# Patient Record
Sex: Female | Born: 1960 | Race: White | Hispanic: No | Marital: Married | Smoking: Never smoker
Health system: Southern US, Community
[De-identification: ages and names within clinical notes are randomized; demographics above are authoritative.]

---

## 1988-03-27 HISTORY — PX: CHOLECYSTECTOMY: SHX55

## 2006-03-27 HISTORY — PX: GASTRIC BYPASS: SHX52

## 2016-08-31 LAB — HM MAMMOGRAPHY

## 2019-04-28 ENCOUNTER — Other Ambulatory Visit: Payer: Self-pay

## 2019-04-28 DIAGNOSIS — N3281 Overactive bladder: Secondary | ICD-10-CM | POA: Insufficient documentation

## 2019-04-28 DIAGNOSIS — F900 Attention-deficit hyperactivity disorder, predominantly inattentive type: Secondary | ICD-10-CM

## 2019-04-28 DIAGNOSIS — D508 Other iron deficiency anemias: Secondary | ICD-10-CM | POA: Insufficient documentation

## 2019-04-28 DIAGNOSIS — E034 Atrophy of thyroid (acquired): Secondary | ICD-10-CM

## 2019-04-28 HISTORY — DX: Atrophy of thyroid (acquired): E03.4

## 2019-04-28 HISTORY — DX: Other iron deficiency anemias: D50.8

## 2019-04-28 HISTORY — DX: Attention-deficit hyperactivity disorder, predominantly inattentive type: F90.0

## 2019-04-28 HISTORY — DX: Overactive bladder: N32.81

## 2019-04-30 ENCOUNTER — Other Ambulatory Visit: Payer: Self-pay

## 2019-04-30 MED ORDER — MELOXICAM 7.5 MG PO TABS: 7.5000 mg | ORAL_TABLET | Freq: Two times a day (BID) | ORAL | 2 refills | Status: DC

## 2019-04-30 MED ORDER — OXYBUTYNIN CHLORIDE ER 15 MG PO TB24: 15.0000 mg | ORAL_TABLET | Freq: Every day | ORAL | 1 refills | Status: DC

## 2019-05-05 ENCOUNTER — Other Ambulatory Visit: Payer: Self-pay

## 2019-05-05 MED ORDER — OXYBUTYNIN CHLORIDE ER 15 MG PO TB24: 15.0000 mg | ORAL_TABLET | Freq: Every day | ORAL | 1 refills | Status: DC

## 2019-05-05 MED ORDER — MELOXICAM 7.5 MG PO TABS: 7.5000 mg | ORAL_TABLET | Freq: Two times a day (BID) | ORAL | 0 refills | Status: DC

## 2019-05-06 ENCOUNTER — Ambulatory Visit: Payer: BC Managed Care – PPO | Admitting: Family Medicine

## 2019-05-06 ENCOUNTER — Encounter: Payer: Self-pay | Admitting: Family Medicine

## 2019-05-06 ENCOUNTER — Other Ambulatory Visit: Payer: Self-pay

## 2019-05-06 VITALS — BP 128/84 | HR 78 | Temp 98.2°F | Resp 16 | Ht 62.0 in | Wt 200.2 lb

## 2019-05-06 DIAGNOSIS — F900 Attention-deficit hyperactivity disorder, predominantly inattentive type: Secondary | ICD-10-CM

## 2019-05-06 DIAGNOSIS — Z23 Encounter for immunization: Secondary | ICD-10-CM | POA: Insufficient documentation

## 2019-05-06 DIAGNOSIS — E28319 Asymptomatic premature menopause: Secondary | ICD-10-CM

## 2019-05-06 DIAGNOSIS — M791 Myalgia, unspecified site: Secondary | ICD-10-CM

## 2019-05-06 DIAGNOSIS — Z6836 Body mass index (BMI) 36.0-36.9, adult: Secondary | ICD-10-CM | POA: Insufficient documentation

## 2019-05-06 DIAGNOSIS — D649 Anemia, unspecified: Secondary | ICD-10-CM | POA: Insufficient documentation

## 2019-05-06 DIAGNOSIS — K909 Intestinal malabsorption, unspecified: Secondary | ICD-10-CM | POA: Insufficient documentation

## 2019-05-06 DIAGNOSIS — Z6837 Body mass index (BMI) 37.0-37.9, adult: Secondary | ICD-10-CM | POA: Insufficient documentation

## 2019-05-06 DIAGNOSIS — Z1211 Encounter for screening for malignant neoplasm of colon: Secondary | ICD-10-CM

## 2019-05-06 DIAGNOSIS — E034 Atrophy of thyroid (acquired): Secondary | ICD-10-CM

## 2019-05-06 DIAGNOSIS — N3941 Urge incontinence: Secondary | ICD-10-CM

## 2019-05-06 DIAGNOSIS — D508 Other iron deficiency anemias: Secondary | ICD-10-CM | POA: Diagnosis not present

## 2019-05-06 DIAGNOSIS — K9089 Other intestinal malabsorption: Secondary | ICD-10-CM

## 2019-05-06 DIAGNOSIS — Z1231 Encounter for screening mammogram for malignant neoplasm of breast: Secondary | ICD-10-CM

## 2019-05-06 MED ORDER — EUTHYROX 137 MCG PO TABS: 137.0000 ug | ORAL_TABLET | Freq: Every day | ORAL | 1 refills | Status: DC

## 2019-05-06 MED ORDER — EZ FLU SHOT-FLUCELVAX QUAD 0.5 ML IM PSKT: 0.5000 mL | PREFILLED_SYRINGE | Freq: Once | INTRAMUSCULAR | 0 refills | Status: AC

## 2019-05-06 MED ORDER — MELOXICAM 7.5 MG PO TABS: 7.5000 mg | ORAL_TABLET | Freq: Two times a day (BID) | ORAL | 0 refills | Status: DC

## 2019-05-06 MED ORDER — OXYBUTYNIN CHLORIDE ER 15 MG PO TB24: 15.0000 mg | ORAL_TABLET | Freq: Every day | ORAL | 1 refills | Status: DC

## 2019-05-06 MED ORDER — AMPHETAMINE-DEXTROAMPHETAMINE 15 MG PO TABS: 1.0000 | ORAL_TABLET | Freq: Two times a day (BID) | ORAL | 0 refills | Status: DC

## 2019-05-06 NOTE — Patient Instructions (Signed)
Labs ordered.  Mammogram and Cologuard ordered.  Return for physical.  Recommend healthy diet and exercise.  All medications refilled.    Mammogram A mammogram is an X-ray of the breasts that is done to check for changes that are not normal. This test can screen for and find any changes that may suggest breast cancer. Mammograms are regularly done on women. A man may have a mammogram if he has a lump or swelling in his breast. This test can also help to find other changes and variations in the breast. Tell a doctor:  About any allergies you have.  If you have breast implants.  If you have had previous breast disease, biopsy, or surgery.  If you are breastfeeding.  If you are younger than age 46.  If you have a family history of breast cancer.  Whether you are pregnant or may be pregnant. What are the risks? Generally, this is a safe procedure. However, problems may occur, including:  Exposure to radiation. Radiation levels are very low with this test.  The results being misinterpreted.  The need for further tests.  The inability of the mammogram to detect certain cancers. What happens before the procedure?  Have this test done about 1-2 weeks after your period. This is usually when your breasts are the least tender.  If you are visiting a new doctor or clinic, send any past mammogram images to your new doctor's office.  Wash your breasts and under your arms the day of the test.  Do not use deodorants, perfumes, lotions, or powders on the day of the test.  Take off any jewelry from your neck.  Wear clothes that you can change into and out of easily. What happens during the procedure?   You will undress from the waist up. You will put on a gown.  You will stand in front of the X-ray machine.  Each breast will be placed between two plastic or glass plates. The plates will press down on your breast for a few seconds. Try to stay as relaxed as possible. This does not  cause any harm to your breasts. Any discomfort you feel will be very brief.  X-rays will be taken from different angles of each breast. The procedure may vary among doctors and hospitals. What happens after the procedure?  The mammogram will be read by a specialist (radiologist).  You may need to do certain parts of the test again. This depends on the quality of the images.  Ask when your test results will be ready. Make sure you get your test results.  You may go back to your normal activities. Summary  A mammogram is a low energy X-ray of the breasts that is done to check for abnormal changes. A man may have this test if he has a lump or swelling in his breast.  Before the procedure, tell your doctor about any breast problems that you have had in the past.  Have this test done about 1-2 weeks after your period.  For the test, each breast will be placed between two plastic or glass plates. The plates will press down on your breast for a few seconds.  The mammogram will be read by a specialist (radiologist). Ask when your test results will be ready. Make sure you get your test results. This information is not intended to replace advice given to you by your health care provider. Make sure you discuss any questions you have with your health care provider. Document Revised:  11/01/2017 Document Reviewed: 11/01/2017 Elsevier Patient Education  2020 [employer].

## 2019-05-06 NOTE — Progress Notes (Signed)
Established Patient Office Visit  Subjective:  Patient ID: Ashley Colon, female    DOB: 23-Sep-1960  Age: 59 y.o. MRN: [id number]  CC:  Chief Complaint  Patient presents with  . Hypothyroidism  . Anemia  . ADHD    HPI Ashley Colon presents for Anemia Presents for follow-up visit. Symptoms include malaise/fatigue. There has been no abdominal pain or fever. Signs of blood loss that are not present include melena and menorrhagia. There are no compliance problems.   Hypothyroidism: Patient presents for evaluation of thyroid function. Symptoms consist of fatigue and muscle pain. Symptoms have present for 2 months. The symptoms are mild.  The problem has been gradually worsening.  Previous thyroid studies include TSH. TSH was normal in 01/2019. The hypothyroidism is due to hypothyroidism unknown etiology.  ADHD - Patient does well on adderal. No issues with attention.  Past Medical History:  Diagnosis Date  . Atrophy of thyroid 04/28/2019  . Attention deficit hyperactivity disorder (ADHD), predominantly inattentive type 04/28/2019  . Other iron deficiency anemias 04/28/2019  . Overactive bladder 04/28/2019    Past Surgical History:  Procedure Laterality Date  . CESAREAN SECTION  1999  . CHOLECYSTECTOMY  1990  . GASTRIC BYPASS  2008    Family History  Problem Relation Age of Onset  . Pulmonary embolism Mother   . Arthritis/Rheumatoid Mother   . Coronary artery disease Father   . Diverticulitis Father   . Arthritis Father   . Lung cancer Maternal Grandfather   . Breast cancer Maternal Aunt     Social History   Socioeconomic History  . Marital status: Married    Spouse name: Not on file  . Number of children: 1  . Years of education: Not on file  . Highest education level: Not on file  Occupational History  . Occupation: Engineer, water  Tobacco Use  . Smoking status: Never Smoker  . Smokeless tobacco: Never Used  Substance and Sexual Activity  . Alcohol use: Yes     Comment: rare  . Drug use: Never  . Sexual activity: Not on file  Other Topics Concern  . Not on file  Social History Narrative  . Not on file   Social Determinants of Health   Financial Resource Strain:   . Difficulty of Paying Living Expenses: Not on file  Food Insecurity:   . Worried About Charity fundraiser in the Last Year: Not on file  . Ran Out of Food in the Last Year: Not on file  Transportation Needs:   . Lack of Transportation (Medical): Not on file  . Lack of Transportation (Non-Medical): Not on file  Physical Activity:   . Days of Exercise per Week: Not on file  . Minutes of Exercise per Session: Not on file  Stress:   . Feeling of Stress : Not on file  Social Connections:   . Frequency of Communication with Friends and Family: Not on file  . Frequency of Social Gatherings with Friends and Family: Not on file  . Attends Religious Services: Not on file  . Active Member of Clubs or Organizations: Not on file  . Attends Archivist Meetings: Not on file  . Marital Status: Not on file  Intimate Partner Violence:   . Fear of Current or Ex-Partner: Not on file  . Emotionally Abused: Not on file  . Physically Abused: Not on file  . Sexually Abused: Not on file    Outpatient Medications Prior to Visit  Medication  Sig Dispense Refill  . Multiple Vitamin (MULTIVITAMIN) tablet Take 1 tablet by mouth 2 (two) times daily.    Marland Kitchen amphetamine-dextroamphetamine (ADDERALL) 15 MG tablet Take 1 tablet by mouth 2 (two) times daily.    Arna Medici 137 MCG tablet Take 137 mcg by mouth daily.    . meloxicam (MOBIC) 7.5 MG tablet Take 1 tablet (7.5 mg total) by mouth 2 (two) times daily. 180 tablet 0  . oxybutynin (DITROPAN XL) 15 MG 24 hr tablet Take 1 tablet (15 mg total) by mouth daily. 90 tablet 1   No facility-administered medications prior to visit.    Allergies  Allergen Reactions  . Oxycodone-Acetaminophen Rash and Itching    ROS Review of Systems   Constitutional: Positive for chills, fatigue and malaise/fatigue. Negative for fever.  HENT: Negative for congestion, ear pain and sore throat.   Respiratory: Negative for cough and shortness of breath.   Cardiovascular: Negative for chest pain.  Gastrointestinal: Negative for abdominal pain, blood in stool, constipation, diarrhea, melena, nausea and vomiting.  Genitourinary: Negative for dysuria, menorrhagia and urgency.       Urge incontinence resolved with oxybutynin.  Musculoskeletal: Positive for myalgias. Negative for arthralgias.  Neurological: Negative for dizziness and headaches.  Psychiatric/Behavioral: Negative for dysphoric mood. The patient is not nervous/anxious.       Objective:     BP 128/84 (BP Location: Left Arm, Patient Position: Sitting, Cuff Size: Normal)   Pulse 78   Temp 98.2 F (36.8 C)   Resp 16   Ht 5' 2"  (1.575 m)   Wt 200 lb 3.2 oz (90.8 kg)   BMI 36.62 kg/m  Wt Readings from Last 3 Encounters:  05/06/19 200 lb 3.2 oz (90.8 kg)  02/03/19 193 lb (87.5 kg)  02/03/19 193 lb (87.5 kg)   Physical Exam  Constitutional: She is oriented to person, place, and time. She appears well-developed and well-nourished.  Cardiovascular: Normal rate, regular rhythm, normal heart sounds and normal pulses.  No murmur heard. Pulmonary/Chest: Effort normal and breath sounds normal. No respiratory distress. She has no wheezes. She has no rales.  Abdominal: Soft. Bowel sounds are normal. There is no abdominal tenderness.  Musculoskeletal:        General: No tenderness or edema. Normal range of motion.  Neurological: She is alert and oriented to person, place, and time.  Psychiatric: She has a normal mood and affect.     Health Maintenance Due  Topic Date Due  . Hepatitis C Screening  04-Mar-1961  . HIV Screening  01/18/1978  . TETANUS/TDAP  01/18/1982  . PAP SMEAR-Modifier  01/19/1984  . MAMMOGRAM  01/18/2013  . COLONOSCOPY  01/18/2013  . INFLUENZA VACCINE   10/26/2018    There are no preventive care reminders to display for this patient.  No results found for: TSH No results found for: WBC, HGB, HCT, MCV, PLT No results found for: NA, K, CHLORIDE, CO2, GLUCOSE, BUN, CREATININE, BILITOT, ALKPHOS, AST, ALT, PROT, ALBUMIN, CALCIUM, ANIONGAP, EGFR, GFR No results found for: CHOL No results found for: HDL No results found for: LDLCALC No results found for: TRIG No results found for: CHOLHDL No results found for: HGBA1C    Assessment & Plan:   Problem List Items Addressed This Visit      Digestive   Malabsorption   Relevant Orders   B12 and Folate Panel   VITAMIN D 25 Hydroxy (Vit-D Deficiency, Fractures)   Comp. Metabolic Panel (12)     Endocrine  Hypothyroidism due to acquired atrophy of thyroid   Relevant Medications   EUTHYROX 137 MCG tablet   Other Relevant Orders   TSH     Other   Attention deficit hyperactivity disorder (ADHD), predominantly inattentive type   Relevant Medications   amphetamine-dextroamphetamine (ADDERALL) 15 MG tablet   Absolute anemia - Primary   Relevant Orders   CBC with Differential/Platelet   Iron, TIBC and Ferritin Panel   Myalgia   Relevant Medications   meloxicam (MOBIC) 7.5 MG tablet   Urge incontinence   Relevant Medications   oxybutynin (DITROPAN XL) 15 MG 24 hr tablet   Needs flu shot   Relevant Medications   Influenza Vac Subunit Quad (EZ FLU SHOT-FLUCELVAX QUAD) 0.5 ML PSKT   Premature menopause   Morbid obesity (HCC)   Relevant Medications   amphetamine-dextroamphetamine (ADDERALL) 15 MG tablet   Other Relevant Orders   Lipid panel   BMI 36.0-36.9,adult   Relevant Orders   Lipid panel    Other Visit Diagnoses    Screen for colon cancer       Relevant Orders   Cologuard   Encounter for screening mammogram for breast cancer       Relevant Orders   MM Digital Screening      Meds ordered this encounter  Medications  . Influenza Vac Subunit Quad (EZ FLU SHOT-FLUCELVAX  QUAD) 0.5 ML PSKT    Sig: Inject 0.5 mLs into the muscle once for 1 dose.    Dispense:  1 each    Refill:  0  . amphetamine-dextroamphetamine (ADDERALL) 15 MG tablet    Sig: Take 1 tablet by mouth 2 (two) times daily.    Dispense:  60 tablet    Refill:  0  . EUTHYROX 137 MCG tablet    Sig: Take 1 tablet (137 mcg total) by mouth daily.    Dispense:  90 tablet    Refill:  1  . meloxicam (MOBIC) 7.5 MG tablet    Sig: Take 1 tablet (7.5 mg total) by mouth 2 (two) times daily.    Dispense:  180 tablet    Refill:  0  . oxybutynin (DITROPAN XL) 15 MG 24 hr tablet    Sig: Take 1 tablet (15 mg total) by mouth daily.    Dispense:  90 tablet    Refill:  1    Follow-up: Return in about 4 weeks (around 06/03/2019) for physical.    Rochel Brome, MD

## 2019-05-07 LAB — CBC WITH DIFFERENTIAL/PLATELET
Basophils Absolute: 0.1 10*3/uL (ref 0.0–0.2)
Basos: 1 %
EOS (ABSOLUTE): 0.2 10*3/uL (ref 0.0–0.4)
Eos: 4 %
Hematocrit: 43.1 % (ref 34.0–46.6)
Hemoglobin: 14 g/dL (ref 11.1–15.9)
Immature Grans (Abs): 0 10*3/uL (ref 0.0–0.1)
Immature Granulocytes: 0 %
Lymphocytes Absolute: 2.1 10*3/uL (ref 0.7–3.1)
Lymphs: 34 %
MCH: 28.3 pg (ref 26.6–33.0)
MCHC: 32.5 g/dL (ref 31.5–35.7)
MCV: 87 fL (ref 79–97)
Monocytes Absolute: 0.4 10*3/uL (ref 0.1–0.9)
Monocytes: 7 %
Neutrophils Absolute: 3.4 10*3/uL (ref 1.4–7.0)
Neutrophils: 54 %
Platelets: 303 10*3/uL (ref 150–450)
RBC: 4.95 x10E6/uL (ref 3.77–5.28)
RDW: 13.7 % (ref 11.7–15.4)
WBC: 6.2 10*3/uL (ref 3.4–10.8)

## 2019-05-07 LAB — LIPID PANEL
Chol/HDL Ratio: 4.7 ratio — ABNORMAL HIGH (ref 0.0–4.4)
Cholesterol, Total: 156 mg/dL (ref 100–199)
HDL: 33 mg/dL — ABNORMAL LOW (ref >39)
LDL Chol Calc (NIH): 94 mg/dL (ref 0–99)
Triglycerides: 163 mg/dL — ABNORMAL HIGH (ref 0–149)
VLDL Cholesterol Cal: 29 mg/dL (ref 5–40)

## 2019-05-07 LAB — COMP. METABOLIC PANEL (12)
AST: 17 IU/L (ref 0–40)
Albumin/Globulin Ratio: 1.6 (ref 1.2–2.2)
Albumin: 4.1 g/dL (ref 3.8–4.9)
Alkaline Phosphatase: 87 IU/L (ref 39–117)
BUN/Creatinine Ratio: 12 (ref 9–23)
BUN: 8 mg/dL (ref 6–24)
Bilirubin Total: 0.4 mg/dL (ref 0.0–1.2)
Calcium: 8.9 mg/dL (ref 8.7–10.2)
Chloride: 105 mmol/L (ref 96–106)
Creatinine, Ser: 0.68 mg/dL (ref 0.57–1.00)
GFR calc Af Amer: 113 mL/min/{1.73_m2} (ref >59)
GFR calc non Af Amer: 98 mL/min/{1.73_m2} (ref >59)
Globulin, Total: 2.5 g/dL (ref 1.5–4.5)
Glucose: 98 mg/dL (ref 65–99)
Potassium: 4 mmol/L (ref 3.5–5.2)
Sodium: 143 mmol/L (ref 134–144)
Total Protein: 6.6 g/dL (ref 6.0–8.5)

## 2019-05-07 LAB — CARDIOVASCULAR RISK ASSESSMENT

## 2019-05-07 LAB — B12 AND FOLATE PANEL
Folate: >20 ng/mL (ref >3.0)
Vitamin B-12: 1070 pg/mL (ref 232–1245)

## 2019-05-07 LAB — IRON,TIBC AND FERRITIN PANEL
Ferritin: 21 ng/mL (ref 15–150)
Iron Saturation: 17 % (ref 15–55)
Iron: 57 ug/dL (ref 27–159)
Total Iron Binding Capacity: 335 ug/dL (ref 250–450)
UIBC: 278 ug/dL (ref 131–425)

## 2019-05-07 LAB — TSH: TSH: 1.6 u[IU]/mL (ref 0.450–4.500)

## 2019-05-07 LAB — VITAMIN D 25 HYDROXY (VIT D DEFICIENCY, FRACTURES): Vit D, 25-Hydroxy: 24.5 ng/mL — ABNORMAL LOW (ref 30.0–100.0)

## 2019-05-08 ENCOUNTER — Other Ambulatory Visit: Payer: Self-pay

## 2019-05-08 DIAGNOSIS — R7989 Other specified abnormal findings of blood chemistry: Secondary | ICD-10-CM

## 2019-05-08 MED ORDER — VITAMIN D (ERGOCALCIFEROL) 1.25 MG (50000 UNIT) PO CAPS: 50000.0000 [IU] | ORAL_CAPSULE | ORAL | 2 refills | Status: DC

## 2019-05-08 NOTE — Telephone Encounter (Signed)
Patient was informed. Sent prescription to [facility]

## 2019-06-03 ENCOUNTER — Ambulatory Visit (INDEPENDENT_AMBULATORY_CARE_PROVIDER_SITE_OTHER): Payer: BC Managed Care – PPO | Admitting: Family Medicine

## 2019-06-03 ENCOUNTER — Encounter: Payer: Self-pay | Admitting: Family Medicine

## 2019-06-03 ENCOUNTER — Other Ambulatory Visit: Payer: Self-pay

## 2019-06-03 VITALS — BP 156/90 | HR 84 | Temp 97.9°F | Resp 16 | Ht 62.0 in | Wt 200.8 lb

## 2019-06-03 DIAGNOSIS — Z1211 Encounter for screening for malignant neoplasm of colon: Secondary | ICD-10-CM | POA: Diagnosis not present

## 2019-06-03 DIAGNOSIS — Z1231 Encounter for screening mammogram for malignant neoplasm of breast: Secondary | ICD-10-CM | POA: Diagnosis not present

## 2019-06-03 DIAGNOSIS — Z0000 Encounter for general adult medical examination without abnormal findings: Secondary | ICD-10-CM | POA: Diagnosis not present

## 2019-06-03 DIAGNOSIS — E6609 Other obesity due to excess calories: Secondary | ICD-10-CM | POA: Diagnosis not present

## 2019-06-03 DIAGNOSIS — Z6836 Body mass index (BMI) 36.0-36.9, adult: Secondary | ICD-10-CM

## 2019-06-03 NOTE — Patient Instructions (Signed)
Order Mammogram. Reorder cologuard. Follow up for bp check.  Advance Directive  Advance directives are legal documents that let you make choices ahead of time about your health care and medical treatment in case you become unable to communicate for yourself. Advance directives are a way for you to make known your wishes to family, friends, and health care providers. This can let others know about your end-of-life care if you become unable to communicate. Discussing and writing advance directives should happen over time rather than all at once. Advance directives can be changed depending on your situation and what you want, even after you have signed the advance directives. There are different types of advance directives, such as:  Medical power of attorney.  Living will.  Do not resuscitate (DNR) or do not attempt resuscitation (DNAR) order. Health care proxy and medical power of attorney A health care proxy is also called a health care agent. This is a person who is appointed to make medical decisions for you in cases where you are unable to make the decisions yourself. Generally, people choose someone they know well and trust to represent their preferences. Make sure to ask this person for an agreement to act as your proxy. A proxy may have to exercise judgment in the event of a medical decision for which your wishes are not known. A medical power of attorney is a legal document that names your health care proxy. Depending on the laws in your state, after the document is written, it may also need to be:  Signed.  Notarized.  Dated.  Copied.  Witnessed.  Incorporated into your medical record. You may also want to appoint someone to manage your money in a situation in which you are unable to do so. This is called a durable power of attorney for finances. It is a separate legal document from the durable power of attorney for health care. You may choose the same person or someone different  from your health care proxy to act as your agent in money matters. If you do not appoint a proxy, or if there is a concern that the proxy is not acting in your best interests, a court may appoint a guardian to act on your behalf. Living will A living will is a set of instructions that state your wishes about medical care when you cannot express them yourself. Health care providers should keep a copy of your living will in your medical record. You may want to give a copy to family members or friends. To alert caregivers in case of an emergency, you can place a card in your wallet to let them know that you have a living will and where they can find it. A living will is used if you become:  Terminally ill.  Disabled.  Unable to communicate or make decisions. Items to consider in your living will include:  To use or not to use life-support equipment, such as dialysis machines and breathing machines (ventilators).  A DNR or DNAR order. This tells health care providers not to use cardiopulmonary resuscitation (CPR) if breathing or heartbeat stops.  To use or not to use tube feeding.  To be given or not to be given food and fluids.  Comfort (palliative) care when the goal becomes comfort rather than a cure.  Donation of organs and tissues. A living will does not give instructions for distributing your money and property if you should pass away. DNR or DNAR A DNR or DNAR order is  a request not to have CPR in the event that your heart stops beating or you stop breathing. If a DNR or DNAR order has not been made and shared, a health care provider will try to help any patient whose heart has stopped or who has stopped breathing. If you plan to have surgery, talk with your health care provider about how your DNR or DNAR order will be followed if problems occur. What if I do not have an advance directive? If you do not have an advance directive, some states assign family decision makers to act on your  behalf based on how closely you are related to them. Each state has its own laws about advance directives. You may want to check with your health care provider, attorney, or state representative about the laws in your state. Summary  Advance directives are the legal documents that allow you to make choices ahead of time about your health care and medical treatment in case you become unable to tell others about your care.  The process of discussing and writing advance directives should happen over time. You can change the advance directives, even after you have signed them.  Advance directives include DNR or DNAR orders, living wills, and designating an agent as your medical power of attorney. This information is not intended to replace advice given to you by your health care provider. Make sure you discuss any questions you have with your health care provider. Document Revised: 10/10/2018 Document Reviewed: 10/10/2018 Elsevier Patient Education  2020 [employer] Preventive Care 56-8 Years Old, Female Preventive care refers to visits with your health care provider and lifestyle choices that can promote health and wellness. This includes:  A yearly physical exam. This may also be called an annual well check.  Regular dental visits and eye exams.  Immunizations.  Screening for certain conditions.  Healthy lifestyle choices, such as eating a healthy diet, getting regular exercise, not using drugs or products that contain nicotine and tobacco, and limiting alcohol use. What can I expect for my preventive care visit? Physical exam Your health care provider will check your:  Height and weight. This may be used to calculate body mass index (BMI), which tells if you are at a healthy weight.  Heart rate and blood pressure.  Skin for abnormal spots. Counseling Your health care provider may ask you questions about your:  Alcohol, tobacco, and drug use.  Emotional well-being.  Home and  relationship well-being.  Sexual activity.  Eating habits.  Work and work Statistician.  Method of birth control.  Menstrual cycle.  Pregnancy history. What immunizations do I need?  Influenza (flu) vaccine  This is recommended every year. Tetanus, diphtheria, and pertussis (Tdap) vaccine  You may need a Td booster every 10 years. Varicella (chickenpox) vaccine  You may need this if you have not been vaccinated. Zoster (shingles) vaccine  You may need this after age 76. Measles, mumps, and rubella (MMR) vaccine  You may need at least one dose of MMR if you were born in 1957 or later. You may also need a second dose. Pneumococcal conjugate (PCV13) vaccine  You may need this if you have certain conditions and were not previously vaccinated. Pneumococcal polysaccharide (PPSV23) vaccine  You may need one or two doses if you smoke cigarettes or if you have certain conditions. Meningococcal conjugate (MenACWY) vaccine  You may need this if you have certain conditions. Hepatitis A vaccine  You may need this if you have certain conditions  or if you travel or work in places where you may be exposed to hepatitis A. Hepatitis B vaccine  You may need this if you have certain conditions or if you travel or work in places where you may be exposed to hepatitis B. Haemophilus influenzae type b (Hib) vaccine  You may need this if you have certain conditions. Human papillomavirus (HPV) vaccine  If recommended by your health care provider, you may need three doses over 6 months. You may receive vaccines as individual doses or as more than one vaccine together in one shot (combination vaccines). Talk with your health care provider about the risks and benefits of combination vaccines. What tests do I need? Blood tests  Lipid and cholesterol levels. These may be checked every 5 years, or more frequently if you are over 29 years old.  Hepatitis C test.  Hepatitis B  test. Screening  Lung cancer screening. You may have this screening every year starting at age 64 if you have a 30-pack-year history of smoking and currently smoke or have quit within the past 15 years.  Colorectal cancer screening. All adults should have this screening starting at age 49 and continuing until age 44. Your health care provider may recommend screening at age 28 if you are at increased risk. You will have tests every 1-10 years, depending on your results and the type of screening test.  Diabetes screening. This is done by checking your blood sugar (glucose) after you have not eaten for a while (fasting). You may have this done every 1-3 years.  Mammogram. This may be done every 1-2 years. Talk with your health care provider about when you should start having regular mammograms. This may depend on whether you have a family history of breast cancer.  BRCA-related cancer screening. This may be done if you have a family history of breast, ovarian, tubal, or peritoneal cancers.  Pelvic exam and Pap test. This may be done every 3 years starting at age 34. Starting at age 96, this may be done every 5 years if you have a Pap test in combination with an HPV test. Other tests  Sexually transmitted disease (STD) testing.  Bone density scan. This is done to screen for osteoporosis. You may have this scan if you are at high risk for osteoporosis. Follow these instructions at home: Eating and drinking  Eat a diet that includes fresh fruits and vegetables, whole grains, lean protein, and low-fat dairy.  Take vitamin and mineral supplements as recommended by your health care provider.  Do not drink alcohol if: ? Your health care provider tells you not to drink. ? You are pregnant, may be pregnant, or are planning to become pregnant.  If you drink alcohol: ? Limit how much you have to 0-1 drink a day. ? Be aware of how much alcohol is in your drink. In the [place] one drink equals one 12  oz bottle of beer (355 mL), one 5 oz glass of wine (148 mL), or one 1 oz glass of hard liquor (44 mL). Lifestyle  Take daily care of your teeth and gums.  Stay active. Exercise for at least 30 minutes on 5 or more days each week.  Do not use any products that contain nicotine or tobacco, such as cigarettes, e-cigarettes, and chewing tobacco. If you need help quitting, ask your health care provider.  If you are sexually active, practice safe sex. Use a condom or other form of birth control (contraception) in order to  prevent pregnancy and STIs (sexually transmitted infections).  If told by your health care provider, take low-dose aspirin daily starting at age 43. What's next?  Visit your health care provider once a year for a well check visit.  Ask your health care provider how often you should have your eyes and teeth checked.  Stay up to date on all vaccines. This information is not intended to replace advice given to you by your health care provider. Make sure you discuss any questions you have with your health care provider. Document Revised: 11/22/2017 Document Reviewed: 11/22/2017 Elsevier Patient Education  2020 [employer].

## 2019-06-03 NOTE — Progress Notes (Signed)
Subjective:  Patient ID: Ashley Colon, female    DOB: 1960/11/21  Age: 59 y.o. MRN: [id number]  Chief Complaint  Patient presents with  . Annual Exam    HPI Encounter for general adult medical examination without abnormal findings  Physical ("At Risk" items are starred): Patient's last physical exam was 1 year ago .  Weight: Appropriate for height (BMI less than 27%) ;  Blood Pressure: Normal (BP less than 120/80) ;  Medical History: Patient history reviewed ; Family history reviewed ;  Allergies Reviewed: No change in current allergies ;  Medications Reviewed: Medications reviewed - no changes ;  Lipids: Normal lipid levels ;  Smoking: Life-long non-smoker ;  Physical Activity: Exercises at least 3 times per week ;  Alcohol/Drug Use: Is a non-drinker ; No illicit drug use ;  Patient is not afflicted from Stress Incontinence and Urge Incontinence  Safety: reviewed ; Patient wears a seat belt, has smoke detectors, has carbon monoxide detectors, practices appropriate gun safety, and wears sunscreen with extended sun exposure. Dental Care: biannual cleanings, brushes and flosses daily. Ophthalmology/Optometry: Annual visit.  Hearing loss: none Vision impairments: none  Social Hx   Social History   Socioeconomic History  . Marital status: Married    Spouse name: Not on file  . Number of children: 1  . Years of education: Not on file  . Highest education level: Not on file  Occupational History  . Occupation: Engineer, water  Tobacco Use  . Smoking status: Never Smoker  . Smokeless tobacco: Never Used  Substance and Sexual Activity  . Alcohol use: Yes    Comment: rare  . Drug use: Never  . Sexual activity: Not on file  Other Topics Concern  . Not on file  Social History Narrative  . Not on file   Social Determinants of Health   Financial Resource Strain:   . Difficulty of Paying Living Expenses:   Food Insecurity:   . Worried About Charity fundraiser in the  Last Year:   . Arboriculturist in the Last Year:   Transportation Needs:   . Film/video editor (Medical):   Marland Kitchen Lack of Transportation (Non-Medical):   Physical Activity:   . Days of Exercise per Week:   . Minutes of Exercise per Session:   Stress:   . Feeling of Stress :   Social Connections:   . Frequency of Communication with Friends and Family:   . Frequency of Social Gatherings with Friends and Family:   . Attends Religious Services:   . Active Member of Clubs or Organizations:   . Attends Archivist Meetings:   Marland Kitchen Marital Status:    Past Medical History:  Diagnosis Date  . Atrophy of thyroid 04/28/2019  . Attention deficit hyperactivity disorder (ADHD), predominantly inattentive type 04/28/2019  . Other iron deficiency anemias 04/28/2019  . Overactive bladder 04/28/2019   Family History  Problem Relation Age of Onset  . Pulmonary embolism Mother   . Arthritis/Rheumatoid Mother   . Coronary artery disease Father   . Diverticulitis Father   . Arthritis Father   . Lung cancer Maternal Grandfather   . Breast cancer Maternal Aunt     Review of Systems  Constitutional: Negative for chills and fever.  HENT: Positive for congestion and rhinorrhea. Negative for ear pain and sore throat.   Respiratory: Negative for cough and shortness of breath.   Cardiovascular: Negative for chest pain.  Gastrointestinal: Negative for abdominal pain,  constipation, diarrhea, nausea and vomiting.  Genitourinary: Negative for dysuria, frequency and urgency.  Musculoskeletal: Negative for arthralgias and myalgias.  Skin: Negative for color change.  Psychiatric/Behavioral: Negative for behavioral problems and dysphoric mood.     Objective:  BP (!) 156/90   Pulse 84   Temp 97.9 F (36.6 C)   Resp 16   Ht 5\' 2"  (1.575 m)   Wt 200 lb 12.8 oz (91.1 kg)   BMI 36.73 kg/m   BP/Weight 06/03/2019 05/06/2019 05/05/2019  Systolic BP 156 128 160  Diastolic BP 90 84 70  Wt. (Lbs) 200.8 200.2  193  BMI 36.73 36.62 35.3    Physical Exam Vitals reviewed.  Constitutional:      General: She is not in acute distress.    Appearance: Normal appearance. She is obese.  HENT:     Right Ear: Tympanic membrane and ear canal normal.     Left Ear: Tympanic membrane and ear canal normal.     Nose: Nose normal. No congestion or rhinorrhea.  Eyes:     Conjunctiva/sclera: Conjunctivae normal.  Neck:     Thyroid: No thyroid mass.  Cardiovascular:     Rate and Rhythm: Normal rate and regular rhythm.     Pulses: Normal pulses.     Heart sounds: No murmur.  Pulmonary:     Effort: Pulmonary effort is normal.     Breath sounds: Normal breath sounds.  Chest:     Breasts:        Right: Normal.        Left: Normal.  Abdominal:     General: Bowel sounds are normal.     Palpations: Abdomen is soft. There is no mass.     Tenderness: There is no abdominal tenderness.  Musculoskeletal:        General: Normal range of motion.  Lymphadenopathy:     Cervical: No cervical adenopathy.  Skin:    General: Skin is warm and dry.  Neurological:     Mental Status: She is alert and oriented to person, place, and time.     Cranial Nerves: No cranial nerve deficit.  Psychiatric:        Mood and Affect: Mood normal.        Behavior: Behavior normal.     Lab Results  Component Value Date   WBC 6.2 05/06/2019   HGB 14.0 05/06/2019   HCT 43.1 05/06/2019   PLT 303 05/06/2019   GLUCOSE 98 05/06/2019   CHOL 156 05/06/2019   TRIG 163 (H) 05/06/2019   HDL 33 (L) 05/06/2019   LDLCALC 94 05/06/2019   AST 17 05/06/2019   NA 143 05/06/2019   K 4.0 05/06/2019   CL 105 05/06/2019   CREATININE 0.68 05/06/2019   BUN 8 05/06/2019   TSH 1.600 05/06/2019      Assessment & Plan:  1. General medical exam Healthy female. Obese. Recommend work on diet and exercise.  Education given. 2. Colon cancer screening  - Ambulatory referral to Gastroenterology - for cologuard.  3. Visit for screening  mammogram  - MM Digital Screening; Future  4. OBESITY - work on diet and exercise.   5. Elevate BP - follow up for nurse visit for bp..   Follow-up: Return in about 2 weeks (around 06/17/2019).  06/19/2019 [facility] [phone]

## 2019-06-07 ENCOUNTER — Other Ambulatory Visit: Payer: Self-pay | Admitting: Family Medicine

## 2019-06-07 DIAGNOSIS — F900 Attention-deficit hyperactivity disorder, predominantly inattentive type: Secondary | ICD-10-CM

## 2019-06-09 MED ORDER — AMPHETAMINE-DEXTROAMPHETAMINE 15 MG PO TABS: 1.0000 | ORAL_TABLET | Freq: Two times a day (BID) | ORAL | 0 refills | Status: DC

## 2019-06-09 NOTE — Telephone Encounter (Signed)
RX refill requet

## 2019-06-12 ENCOUNTER — Encounter: Payer: Self-pay | Admitting: Family Medicine

## 2019-06-30 ENCOUNTER — Ambulatory Visit: Payer: BC Managed Care – PPO

## 2019-07-10 LAB — COLOGUARD
COLOGUARD: NEGATIVE
Cologuard: NEGATIVE

## 2019-07-10 LAB — EXTERNAL GENERIC LAB PROCEDURE: COLOGUARD: NEGATIVE

## 2019-07-22 ENCOUNTER — Other Ambulatory Visit: Payer: Self-pay | Admitting: Family Medicine

## 2019-07-22 DIAGNOSIS — R7989 Other specified abnormal findings of blood chemistry: Secondary | ICD-10-CM

## 2019-07-22 DIAGNOSIS — F900 Attention-deficit hyperactivity disorder, predominantly inattentive type: Secondary | ICD-10-CM

## 2019-07-22 MED ORDER — AMPHETAMINE-DEXTROAMPHETAMINE 15 MG PO TABS: 1.0000 | ORAL_TABLET | Freq: Two times a day (BID) | ORAL | 0 refills | Status: DC

## 2019-07-22 NOTE — Telephone Encounter (Signed)
Patient requesting refill. 

## 2019-08-29 ENCOUNTER — Other Ambulatory Visit: Payer: Self-pay | Admitting: Family Medicine

## 2019-08-29 DIAGNOSIS — R7989 Other specified abnormal findings of blood chemistry: Secondary | ICD-10-CM

## 2019-09-05 ENCOUNTER — Other Ambulatory Visit: Payer: Self-pay | Admitting: Family Medicine

## 2019-09-05 DIAGNOSIS — F900 Attention-deficit hyperactivity disorder, predominantly inattentive type: Secondary | ICD-10-CM

## 2019-09-05 DIAGNOSIS — M791 Myalgia, unspecified site: Secondary | ICD-10-CM

## 2019-09-05 MED ORDER — AMPHETAMINE-DEXTROAMPHETAMINE 15 MG PO TABS: 1.0000 | ORAL_TABLET | Freq: Two times a day (BID) | ORAL | 0 refills | Status: DC

## 2019-09-05 MED ORDER — MELOXICAM 7.5 MG PO TABS: 7.5000 mg | ORAL_TABLET | Freq: Two times a day (BID) | ORAL | 0 refills | Status: DC

## 2019-10-21 ENCOUNTER — Other Ambulatory Visit: Payer: Self-pay | Admitting: Family Medicine

## 2019-10-21 DIAGNOSIS — F900 Attention-deficit hyperactivity disorder, predominantly inattentive type: Secondary | ICD-10-CM

## 2019-10-23 ENCOUNTER — Other Ambulatory Visit: Payer: Self-pay | Admitting: Family Medicine

## 2019-10-23 DIAGNOSIS — F900 Attention-deficit hyperactivity disorder, predominantly inattentive type: Secondary | ICD-10-CM

## 2019-10-25 ENCOUNTER — Other Ambulatory Visit: Payer: Self-pay | Admitting: Family Medicine

## 2019-10-25 DIAGNOSIS — F900 Attention-deficit hyperactivity disorder, predominantly inattentive type: Secondary | ICD-10-CM

## 2019-10-27 NOTE — Telephone Encounter (Signed)
Refill request

## 2019-11-19 ENCOUNTER — Other Ambulatory Visit: Payer: Self-pay

## 2019-11-19 ENCOUNTER — Encounter: Payer: Self-pay | Admitting: Family Medicine

## 2019-11-19 ENCOUNTER — Ambulatory Visit: Payer: BC Managed Care – PPO | Admitting: Family Medicine

## 2019-11-19 VITALS — BP 118/70 | HR 68 | Temp 97.4°F | Resp 17 | Ht 62.0 in | Wt 200.2 lb

## 2019-11-19 DIAGNOSIS — R5383 Other fatigue: Secondary | ICD-10-CM | POA: Insufficient documentation

## 2019-11-19 DIAGNOSIS — E559 Vitamin D deficiency, unspecified: Secondary | ICD-10-CM

## 2019-11-19 DIAGNOSIS — E034 Atrophy of thyroid (acquired): Secondary | ICD-10-CM | POA: Diagnosis not present

## 2019-11-19 DIAGNOSIS — F900 Attention-deficit hyperactivity disorder, predominantly inattentive type: Secondary | ICD-10-CM | POA: Diagnosis not present

## 2019-11-19 DIAGNOSIS — N3941 Urge incontinence: Secondary | ICD-10-CM

## 2019-11-19 MED ORDER — AMPHETAMINE-DEXTROAMPHETAMINE 15 MG PO TABS: 1.0000 | ORAL_TABLET | Freq: Two times a day (BID) | ORAL | 0 refills | Status: DC

## 2019-11-19 MED ORDER — OXYBUTYNIN CHLORIDE ER 15 MG PO TB24: 15.0000 mg | ORAL_TABLET | Freq: Every day | ORAL | 3 refills | Status: DC

## 2019-11-19 NOTE — Progress Notes (Signed)
Subjective:  Patient ID: Ashley Colon, female    DOB: [birth year]  Age: 59 y.o. MRN: [id number]  Chief Complaint  Patient presents with  . ADHD    HPI   Patient presents with history of attention-deficit hyperactivity disorder, predominantly inattentive type.  symptoms include easy distractability, inability to complete tasks, difficulty sustaining attention, shifting from one uncompleted activity to another, and frequently losing items.  PAM completed some college.  No school years have had to be repeated.  Job performance is generally regarded as excellent.  Testing for learning disabilities has not been done.  Birth, neurological and psychiatric histories are all unremarkable.  No signs of depression are noted by patient or parents.  There are no apparent manifestations of a conduct disorder.  Currently on Adderall 15 mg BID, which is working great.    Hypothyroidism - Taking euthyroix 137 mcg once daily  Urge incontinence - oxybutynin helps.   Fatigue - Labs normal except vitamin d low.  Unable to identify cause.    Current Outpatient Medications on File Prior to Visit  Medication Sig Dispense Refill  . EUTHYROX 137 MCG tablet Take 1 tablet (137 mcg total) by mouth daily. 90 tablet 1  . meloxicam (MOBIC) 7.5 MG tablet Take 1 tablet (7.5 mg total) by mouth 2 (two) times daily. 180 tablet 0  . Multiple Vitamin (MULTIVITAMIN) tablet Take 1 tablet by mouth 2 (two) times daily.    . Vitamin D, Ergocalciferol, (DRISDOL) 1.25 MG (50000 UNIT) CAPS capsule Take 1 capsule by mouth once a week 12 capsule 1   No current facility-administered medications on file prior to visit.   Past Medical History:  Diagnosis Date  . Atrophy of thyroid 04/28/2019  . Attention deficit hyperactivity disorder (ADHD), predominantly inattentive type 04/28/2019  . Other iron deficiency anemias 04/28/2019  . Overactive bladder 04/28/2019   Past Surgical History:  Procedure Laterality Date  . CESAREAN SECTION   1999  . CHOLECYSTECTOMY  1990  . GASTRIC BYPASS  2008    Family History  Problem Relation Age of Onset  . Pulmonary embolism Mother   . Arthritis/Rheumatoid Mother   . Coronary artery disease Father   . Diverticulitis Father   . Arthritis Father   . Lung cancer Maternal Grandfather   . Breast cancer Maternal Aunt    Social History   Socioeconomic History  . Marital status: Married    Spouse name: Not on file  . Number of children: 1  . Years of education: Not on file  . Highest education level: Not on file  Occupational History  . Occupation: Technical sales engineer  Tobacco Use  . Smoking status: Never Smoker  . Smokeless tobacco: Never Used  Vaping Use  . Vaping Use: Never used  Substance and Sexual Activity  . Alcohol use: Yes    Comment: rare  . Drug use: Never  . Sexual activity: Not Currently  Other Topics Concern  . Not on file  Social History Narrative  . Not on file   Social Determinants of Health   Financial Resource Strain:   . Difficulty of Paying Living Expenses: Not on file  Food Insecurity:   . Worried About Programme researcher, broadcasting/film/video in the Last Year: Not on file  . Ran Out of Food in the Last Year: Not on file  Transportation Needs:   . Lack of Transportation (Medical): Not on file  . Lack of Transportation (Non-Medical): Not on file  Physical Activity:   . Days of  Exercise per Week: Not on file  . Minutes of Exercise per Session: Not on file  Stress:   . Feeling of Stress : Not on file  Social Connections:   . Frequency of Communication with Friends and Family: Not on file  . Frequency of Social Gatherings with Friends and Family: Not on file  . Attends Religious Services: Not on file  . Active Member of Clubs or Organizations: Not on file  . Attends Banker Meetings: Not on file  . Marital Status: Not on file    Review of Systems  Constitutional: Negative for chills, diaphoresis, fatigue and fever.  HENT: Negative for congestion and  ear pain.   Respiratory: Negative for apnea, cough and shortness of breath.   Cardiovascular: Negative for chest pain and palpitations.  Gastrointestinal: Negative for abdominal pain, constipation, diarrhea, nausea and rectal pain.  Endocrine: Negative for polyuria.  Genitourinary: Negative for dysuria.  Psychiatric/Behavioral: Negative for dysphoric mood.     Objective:  BP 118/70 (BP Location: Right Arm, Patient Position: Sitting)   Pulse 68   Temp (!) 97.4 F (36.3 C) (Temporal)   Resp 17   Ht 5\' 2"  (1.575 m)   Wt 200 lb 3.2 oz (90.8 kg)   BMI 36.62 kg/m   BP/Weight 11/19/2019 06/03/2019 05/06/2019  Systolic BP 118 156 128  Diastolic BP 70 90 84  Wt. (Lbs) 200.2 200.8 200.2  BMI 36.62 36.73 36.62    Physical Exam Vitals reviewed.  Constitutional:      Appearance: Normal appearance. She is normal weight.  Cardiovascular:     Rate and Rhythm: Normal rate and regular rhythm.     Pulses: Normal pulses.     Heart sounds: Normal heart sounds.  Pulmonary:     Effort: Pulmonary effort is normal. No respiratory distress.     Breath sounds: Normal breath sounds.  Abdominal:     General: Abdomen is flat. Bowel sounds are normal.     Palpations: Abdomen is soft.     Tenderness: There is no abdominal tenderness.  Neurological:     Mental Status: She is alert and oriented to person, place, and time.  Psychiatric:        Mood and Affect: Mood normal.        Behavior: Behavior normal.     Diabetic Foot Exam - Simple   No data filed       Lab Results  Component Value Date   WBC 6.2 05/06/2019   HGB 14.0 05/06/2019   HCT 43.1 05/06/2019   PLT 303 05/06/2019   GLUCOSE 98 05/06/2019   CHOL 156 05/06/2019   TRIG 163 (H) 05/06/2019   HDL 33 (L) 05/06/2019   LDLCALC 94 05/06/2019   AST 17 05/06/2019   NA 143 05/06/2019   K 4.0 05/06/2019   CL 105 05/06/2019   CREATININE 0.68 05/06/2019   BUN 8 05/06/2019   TSH 0.695 11/25/2019      Assessment & Plan:  1. Vitamin  D insufficiency - VITAMIN D 25 Hydroxy (Vit-D Deficiency, Fractures); Future  2. Hypothyroidism due to acquired atrophy of thyroid - TSH; Future  3. Other fatigue Recommend regular sleep schedule.  4. Urge incontinence - oxybutynin (DITROPAN XL) 15 MG 24 hr tablet; Take 1 tablet (15 mg total) by mouth daily.  Dispense: 90 tablet; Refill: 3  5. Attention deficit hyperactivity disorder (ADHD), predominantly inattentive type Well controlled. - amphetamine-dextroamphetamine (ADDERALL) 15 MG tablet; Take 1 tablet by mouth 2 (two) times  daily.  Dispense: 60 tablet; Refill: 0 Follow-up: Return in about 6 months (around 05/21/2020).  An After Visit Summary was printed and given to the patient.  Blane Ohara [facility] [phone]

## 2019-11-25 ENCOUNTER — Ambulatory Visit: Payer: BC Managed Care – PPO

## 2019-11-25 DIAGNOSIS — E034 Atrophy of thyroid (acquired): Secondary | ICD-10-CM

## 2019-11-25 DIAGNOSIS — E559 Vitamin D deficiency, unspecified: Secondary | ICD-10-CM

## 2019-11-26 LAB — VITAMIN D 25 HYDROXY (VIT D DEFICIENCY, FRACTURES): Vit D, 25-Hydroxy: 34.9 ng/mL (ref 30.0–100.0)

## 2019-11-26 LAB — TSH: TSH: 0.695 u[IU]/mL (ref 0.450–4.500)

## 2020-01-12 ENCOUNTER — Other Ambulatory Visit: Payer: Self-pay | Admitting: Family Medicine

## 2020-01-12 DIAGNOSIS — F900 Attention-deficit hyperactivity disorder, predominantly inattentive type: Secondary | ICD-10-CM

## 2020-01-14 ENCOUNTER — Other Ambulatory Visit: Payer: Self-pay

## 2020-01-14 DIAGNOSIS — F900 Attention-deficit hyperactivity disorder, predominantly inattentive type: Secondary | ICD-10-CM

## 2020-01-14 MED ORDER — AMPHETAMINE-DEXTROAMPHETAMINE 15 MG PO TABS: 1.0000 | ORAL_TABLET | Freq: Two times a day (BID) | ORAL | 0 refills | Status: DC

## 2020-01-26 ENCOUNTER — Other Ambulatory Visit: Payer: Self-pay | Admitting: Family Medicine

## 2020-01-26 DIAGNOSIS — E034 Atrophy of thyroid (acquired): Secondary | ICD-10-CM

## 2020-02-06 ENCOUNTER — Other Ambulatory Visit: Payer: Self-pay | Admitting: Family Medicine

## 2020-02-06 DIAGNOSIS — R7989 Other specified abnormal findings of blood chemistry: Secondary | ICD-10-CM

## 2020-02-21 ENCOUNTER — Other Ambulatory Visit: Payer: Self-pay | Admitting: Family Medicine

## 2020-02-21 DIAGNOSIS — F900 Attention-deficit hyperactivity disorder, predominantly inattentive type: Secondary | ICD-10-CM

## 2020-02-26 ENCOUNTER — Other Ambulatory Visit: Payer: Self-pay | Admitting: Family Medicine

## 2020-02-26 DIAGNOSIS — F900 Attention-deficit hyperactivity disorder, predominantly inattentive type: Secondary | ICD-10-CM

## 2020-02-27 MED ORDER — AMPHETAMINE-DEXTROAMPHETAMINE 15 MG PO TABS: 1.0000 | ORAL_TABLET | Freq: Two times a day (BID) | ORAL | 0 refills | Status: DC

## 2020-04-06 ENCOUNTER — Other Ambulatory Visit: Payer: Self-pay | Admitting: Family Medicine

## 2020-04-06 DIAGNOSIS — F900 Attention-deficit hyperactivity disorder, predominantly inattentive type: Secondary | ICD-10-CM

## 2020-04-06 MED ORDER — AMPHETAMINE-DEXTROAMPHETAMINE 15 MG PO TABS: 1.0000 | ORAL_TABLET | Freq: Two times a day (BID) | ORAL | 0 refills | Status: DC

## 2020-04-30 ENCOUNTER — Other Ambulatory Visit: Payer: Self-pay | Admitting: Family Medicine

## 2020-04-30 DIAGNOSIS — R7989 Other specified abnormal findings of blood chemistry: Secondary | ICD-10-CM

## 2020-04-30 DIAGNOSIS — M791 Myalgia, unspecified site: Secondary | ICD-10-CM

## 2020-04-30 DIAGNOSIS — E034 Atrophy of thyroid (acquired): Secondary | ICD-10-CM

## 2020-05-24 NOTE — Progress Notes (Signed)
Subjective:  Patient ID: Ashley Colon, female    DOB: 21-Dec-1960  Age: 60 y.o. MRN: [id number]  Chief Complaint  Patient presents with  . ADHD    HPI Hypothyroidism due to acquired atrophy of thyroid - Patient is currently taking euthyrox 137 mcg daily.  Attention deficit hyperactivity disorder (ADHD), predominantly inattentive type - Patient is taking adderall 15 mg twice a day. Doing well for attention. No side effects.   Vitamin D insufficiency - Patient takes vitamin D 89381 units once a week.  Overactive bladder - Patient currently in oxybutynin 15 mg daily. Helping urge incontinence.   Other iron deficiency anemias On iron sulfate 325 mg once daily. Cologuard within the last year and it was normal.    Hyperlipidemia: Not on any medicines. Eating healthy. Goes to exercise classes weekly.   Current Outpatient Medications on File Prior to Visit  Medication Sig Dispense Refill  . amphetamine-dextroamphetamine (ADDERALL) 15 MG tablet Take 1 tablet by mouth 2 (two) times daily. 60 tablet 0  . EUTHYROX 137 MCG tablet Take 1 tablet by mouth once daily 90 tablet 0  . meloxicam (MOBIC) 7.5 MG tablet Take 1 tablet by mouth twice daily 180 tablet 0  . Multiple Vitamin (MULTIVITAMIN) tablet Take 1 tablet by mouth 2 (two) times daily.    Marland Kitchen oxybutynin (DITROPAN XL) 15 MG 24 hr tablet Take 1 tablet (15 mg total) by mouth daily. 90 tablet 3  . Vitamin D, Ergocalciferol, (DRISDOL) 1.25 MG (50000 UNIT) CAPS capsule Take 1 capsule by mouth once a week 12 capsule 0   No current facility-administered medications on file prior to visit.   Past Medical History:  Diagnosis Date  . Atrophy of thyroid 04/28/2019  . Attention deficit hyperactivity disorder (ADHD), predominantly inattentive type 04/28/2019  . Other iron deficiency anemias 04/28/2019  . Overactive bladder 04/28/2019   Past Surgical History:  Procedure Laterality Date  . CESAREAN SECTION  1999  . CHOLECYSTECTOMY  1990  .  GASTRIC BYPASS  2008    Family History  Problem Relation Age of Onset  . Pulmonary embolism Mother   . Arthritis/Rheumatoid Mother   . Coronary artery disease Father   . Diverticulitis Father   . Arthritis Father   . Lung cancer Maternal Grandfather   . Breast cancer Maternal Aunt    Social History   Socioeconomic History  . Marital status: Married    Spouse name: Not on file  . Number of children: 1  . Years of education: Not on file  . Highest education level: Not on file  Occupational History  . Occupation: Technical sales engineer  Tobacco Use  . Smoking status: Never Smoker  . Smokeless tobacco: Never Used  Vaping Use  . Vaping Use: Never used  Substance and Sexual Activity  . Alcohol use: Yes    Comment: rare  . Drug use: Never  . Sexual activity: Not Currently  Other Topics Concern  . Not on file  Social History Narrative  . Not on file   Social Determinants of Health   Financial Resource Strain: Not on file  Food Insecurity: Not on file  Transportation Needs: Not on file  Physical Activity: Not on file  Stress: Not on file  Social Connections: Not on file    Review of Systems  Constitutional: Positive for fatigue. Negative for chills and fever.  HENT: Negative for congestion, ear pain and sore throat.   Respiratory: Negative for cough and shortness of breath.   Cardiovascular: Negative  for chest pain and palpitations.  Gastrointestinal: Negative for abdominal pain, constipation, diarrhea, nausea and vomiting.  Endocrine: Negative for polydipsia, polyphagia and polyuria.  Genitourinary: Negative for difficulty urinating and dysuria.  Musculoskeletal: Negative for arthralgias, back pain and myalgias.  Skin: Negative for rash.  Neurological: Negative for headaches.  Psychiatric/Behavioral: Negative for dysphoric mood. The patient is not nervous/anxious.      Objective:  BP 136/82   Pulse 84   Temp (!) 97.2 F (36.2 C)   Ht 5\' 2"  (1.575 m)   Wt 199 lb  (90.3 kg)   SpO2 99%   BMI 36.40 kg/m   BP/Weight 05/25/2020 11/19/2019 06/03/2019  Systolic BP 136 118 156  Diastolic BP 82 70 90  Wt. (Lbs) 199 200.2 200.8  BMI 36.4 36.62 36.73    Physical Exam Vitals reviewed.  Constitutional:      Appearance: Normal appearance. She is obese.  Neck:     Vascular: No carotid bruit.  Cardiovascular:     Rate and Rhythm: Normal rate and regular rhythm.     Pulses: Normal pulses.     Heart sounds: Normal heart sounds.  Pulmonary:     Effort: Pulmonary effort is normal.     Breath sounds: Normal breath sounds.  Abdominal:     General: Bowel sounds are normal.     Palpations: Abdomen is soft.     Tenderness: There is no abdominal tenderness.  Neurological:     Mental Status: She is alert and oriented to person, place, and time.  Psychiatric:        Mood and Affect: Mood normal.        Behavior: Behavior normal.     Diabetic Foot Exam - Simple   No data filed      Lab Results  Component Value Date   WBC 7.3 05/25/2020   HGB 13.5 05/25/2020   HCT 41.3 05/25/2020   PLT 328 05/25/2020   GLUCOSE 112 (H) 05/25/2020   CHOL 183 05/25/2020   TRIG 200 (H) 05/25/2020   HDL 31 (L) 05/25/2020   LDLCALC 117 (H) 05/25/2020   ALT 16 05/25/2020   AST 19 05/25/2020   NA 141 05/25/2020   K 4.8 05/25/2020   CL 102 05/25/2020   CREATININE 0.69 05/25/2020   BUN 11 05/25/2020   CO2 24 05/25/2020   TSH 0.579 05/25/2020      Assessment & Plan:   1. Hypothyroidism due to acquired atrophy of thyroid The current medical regimen is effective;  continue present plan and medications. - T4, free - TSH  2. Attention deficit hyperactivity disorder (ADHD), predominantly inattentive type The current medical regimen is effective;  continue present plan and medications.  3. Vitamin D insufficiency - VITAMIN D 25 Hydroxy (Vit-D Deficiency, Fractures)  4. Overactive bladder Continue oxybutynin.  5. Other iron deficiency anemias - CBC with  Differential/Platelet  6. Other specified intestinal malabsorption - Comprehensive metabolic panel - 07/25/2020 and Folate Panel - Methylmalonic acid, serum  7. Class 2 obesity due to excess calories without serious comorbidity with body mass index (BMI) of 36.0 to 36.9 in adult - Lipid panel    8. Mixed hyperlipidemia:  Follow-up: Return in about 1 week (around 06/01/2020) for CPE.  An After Visit Summary was printed and given to the patient.  08/01/2020, MD [facility] [phone]

## 2020-05-25 ENCOUNTER — Other Ambulatory Visit: Payer: Self-pay

## 2020-05-25 ENCOUNTER — Encounter: Payer: Self-pay | Admitting: Family Medicine

## 2020-05-25 ENCOUNTER — Ambulatory Visit: Payer: BC Managed Care – PPO | Admitting: Family Medicine

## 2020-05-25 VITALS — BP 136/82 | HR 84 | Temp 97.2°F | Ht 62.0 in | Wt 199.0 lb

## 2020-05-25 DIAGNOSIS — F900 Attention-deficit hyperactivity disorder, predominantly inattentive type: Secondary | ICD-10-CM

## 2020-05-25 DIAGNOSIS — E034 Atrophy of thyroid (acquired): Secondary | ICD-10-CM | POA: Diagnosis not present

## 2020-05-25 DIAGNOSIS — E66812 Obesity, class 2: Secondary | ICD-10-CM

## 2020-05-25 DIAGNOSIS — E782 Mixed hyperlipidemia: Secondary | ICD-10-CM

## 2020-05-25 DIAGNOSIS — Z6836 Body mass index (BMI) 36.0-36.9, adult: Secondary | ICD-10-CM

## 2020-05-25 DIAGNOSIS — N3281 Overactive bladder: Secondary | ICD-10-CM

## 2020-05-25 DIAGNOSIS — E559 Vitamin D deficiency, unspecified: Secondary | ICD-10-CM

## 2020-05-25 DIAGNOSIS — D508 Other iron deficiency anemias: Secondary | ICD-10-CM

## 2020-05-25 DIAGNOSIS — E6609 Other obesity due to excess calories: Secondary | ICD-10-CM

## 2020-05-25 DIAGNOSIS — K9089 Other intestinal malabsorption: Secondary | ICD-10-CM

## 2020-05-30 LAB — COMPREHENSIVE METABOLIC PANEL
ALT: 16 IU/L (ref 0–32)
AST: 19 IU/L (ref 0–40)
Albumin/Globulin Ratio: 1.7 (ref 1.2–2.2)
Albumin: 4.3 g/dL (ref 3.8–4.9)
Alkaline Phosphatase: 95 IU/L (ref 44–121)
BUN/Creatinine Ratio: 16 (ref 9–23)
BUN: 11 mg/dL (ref 6–24)
Bilirubin Total: 0.4 mg/dL (ref 0.0–1.2)
CO2: 24 mmol/L (ref 20–29)
Calcium: 9.2 mg/dL (ref 8.7–10.2)
Chloride: 102 mmol/L (ref 96–106)
Creatinine, Ser: 0.69 mg/dL (ref 0.57–1.00)
Globulin, Total: 2.5 g/dL (ref 1.5–4.5)
Glucose: 112 mg/dL — ABNORMAL HIGH (ref 65–99)
Potassium: 4.8 mmol/L (ref 3.5–5.2)
Sodium: 141 mmol/L (ref 134–144)
Total Protein: 6.8 g/dL (ref 6.0–8.5)
eGFR: 101 mL/min/{1.73_m2} (ref >59)

## 2020-05-30 LAB — LIPID PANEL
Chol/HDL Ratio: 5.9 ratio — ABNORMAL HIGH (ref 0.0–4.4)
Cholesterol, Total: 183 mg/dL (ref 100–199)
HDL: 31 mg/dL — ABNORMAL LOW (ref >39)
LDL Chol Calc (NIH): 117 mg/dL — ABNORMAL HIGH (ref 0–99)
Triglycerides: 200 mg/dL — ABNORMAL HIGH (ref 0–149)
VLDL Cholesterol Cal: 35 mg/dL (ref 5–40)

## 2020-05-30 LAB — CBC WITH DIFFERENTIAL/PLATELET
Basophils Absolute: 0.1 10*3/uL (ref 0.0–0.2)
Basos: 1 %
EOS (ABSOLUTE): 0.2 10*3/uL (ref 0.0–0.4)
Eos: 3 %
Hematocrit: 41.3 % (ref 34.0–46.6)
Hemoglobin: 13.5 g/dL (ref 11.1–15.9)
Immature Grans (Abs): 0 10*3/uL (ref 0.0–0.1)
Immature Granulocytes: 0 %
Lymphocytes Absolute: 2.1 10*3/uL (ref 0.7–3.1)
Lymphs: 28 %
MCH: 27.2 pg (ref 26.6–33.0)
MCHC: 32.7 g/dL (ref 31.5–35.7)
MCV: 83 fL (ref 79–97)
Monocytes Absolute: 0.6 10*3/uL (ref 0.1–0.9)
Monocytes: 8 %
Neutrophils Absolute: 4.4 10*3/uL (ref 1.4–7.0)
Neutrophils: 60 %
Platelets: 328 10*3/uL (ref 150–450)
RBC: 4.97 x10E6/uL (ref 3.77–5.28)
RDW: 13.4 % (ref 11.7–15.4)
WBC: 7.3 10*3/uL (ref 3.4–10.8)

## 2020-05-30 LAB — VITAMIN D 25 HYDROXY (VIT D DEFICIENCY, FRACTURES): Vit D, 25-Hydroxy: 34.3 ng/mL (ref 30.0–100.0)

## 2020-05-30 LAB — CARDIOVASCULAR RISK ASSESSMENT

## 2020-05-30 LAB — METHYLMALONIC ACID, SERUM: Methylmalonic Acid: 128 nmol/L (ref 0–378)

## 2020-05-30 LAB — B12 AND FOLATE PANEL
Folate: >20 ng/mL (ref >3.0)
Vitamin B-12: 620 pg/mL (ref 232–1245)

## 2020-05-30 LAB — TSH: TSH: 0.579 u[IU]/mL (ref 0.450–4.500)

## 2020-05-30 LAB — T4, FREE: Free T4: 1.3 ng/dL (ref 0.82–1.77)

## 2020-06-02 NOTE — Progress Notes (Signed)
Subjective:  Patient ID: Ashley Colon, female    DOB: [birth year]  Age: 60 y.o. MRN: [id number]  Chief Complaint  Patient presents with  . Annual Exam    HPI Well Adult Physical: Patient here for a comprehensive physical exam.The patient reports no problems Do you take any herbs or supplements that were not prescribed by a doctor? no Are you taking calcium supplements? no Are you taking aspirin daily? no  Encounter for general adult medical examination without abnormal findings  Physical ("At Risk" items are starred): Patient's last physical exam was 1 year ago .  Smoking: Life-long non-smoker ;  Physical Activity: Exercises at least 2 times per week ;  Alcohol/Drug Use: Is a social drinker ; No illicit drug use ;  Patient is not afflicted from Stress Incontinence and Urge Incontinence  Safety: reviewed. Patient wears a seat belt, has smoke detectors, has carbon monoxide detectors, practices appropriate gun safety, and wears sunscreen with extended sun exposure. Dental Care: does not have biannual cleanings she does brushes and flosses daily. Ophthalmology/Optometry: Annual visit.  Hearing loss: none Vision impairments: Contacts or Glasses  Menarche: 11 Menstrual History: regular LMP: Over 15 years ago Pregnancy history: 1 Safe at home: yes Self breast exams: Inconsistently  Mammogram 08/16/2016  Flowsheet Row Office Visit from 06/03/2020 in [facility]  PHQ-2 Total Score 0      Current Exercise Habits: Structured exercise class, Time (Minutes): 60, Frequency (Times/Week): 2, Weekly Exercise (Minutes/Week): 120       Social Hx   Social History   Socioeconomic History  . Marital status: Married    Spouse name: Not on file  . Number of children: 1  . Years of education: Not on file  . Highest education level: Not on file  Occupational History  . Occupation: Technical sales engineer  Tobacco Use  . Smoking status: Never Smoker  . Smokeless tobacco: Never Used   Vaping Use  . Vaping Use: Never used  Substance and Sexual Activity  . Alcohol use: Yes    Comment: rare  . Drug use: Never  . Sexual activity: Not Currently  Other Topics Concern  . Not on file  Social History Narrative  . Not on file   Social Determinants of Health   Financial Resource Strain: Not on file  Food Insecurity: Not on file  Transportation Needs: Not on file  Physical Activity: Not on file  Stress: Not on file  Social Connections: Not on file   Past Medical History:  Diagnosis Date  . Atrophy of thyroid 04/28/2019  . Attention deficit hyperactivity disorder (ADHD), predominantly inattentive type 04/28/2019  . Other iron deficiency anemias 04/28/2019  . Overactive bladder 04/28/2019   Past Surgical History:  Procedure Laterality Date  . CESAREAN SECTION  1999  . CHOLECYSTECTOMY  1990  . GASTRIC BYPASS  2008    Family History  Problem Relation Age of Onset  . Pulmonary embolism Mother   . Arthritis/Rheumatoid Mother   . Coronary artery disease Father   . Diverticulitis Father   . Arthritis Father   . Lung cancer Maternal Grandfather   . Breast cancer Maternal Aunt     Review of Systems   Objective:  BP 122/82   Pulse 82   Temp (!) 96.9 F (36.1 C)   Ht 5\' 2"  (1.575 m)   Wt 199 lb (90.3 kg)   SpO2 100%   BMI 36.40 kg/m   BP/Weight 06/03/2020 05/25/2020 11/19/2019  Systolic BP 122 136 118  Diastolic BP 82 82 70  Wt. (Lbs) 199 199 200.2  BMI 36.4 36.4 36.62    Physical Exam Vitals reviewed.  Constitutional:      General: She is not in acute distress.    Appearance: Normal appearance. She is obese.  HENT:     Right Ear: Tympanic membrane and ear canal normal.     Left Ear: Tympanic membrane and ear canal normal.     Nose: Nose normal. No congestion or rhinorrhea.  Eyes:     Conjunctiva/sclera: Conjunctivae normal.  Neck:     Thyroid: No thyroid mass.  Cardiovascular:     Rate and Rhythm: Normal rate and regular rhythm.     Pulses: Normal  pulses.     Heart sounds: Normal heart sounds. No murmur heard.   Pulmonary:     Effort: Pulmonary effort is normal.     Breath sounds: Normal breath sounds.  Chest:  Breasts:     Right: Normal. No axillary adenopathy.     Left: Normal. No axillary adenopathy.    Abdominal:     General: Bowel sounds are normal.     Palpations: Abdomen is soft. There is no mass.     Tenderness: There is no abdominal tenderness.  Genitourinary:    General: Normal vulva.     Vagina: No vaginal discharge.     Comments: Cervix normal.  Musculoskeletal:        General: Normal range of motion.  Lymphadenopathy:     Cervical: No cervical adenopathy.     Upper Body:     Right upper body: No axillary adenopathy.     Left upper body: No axillary adenopathy.  Skin:    General: Skin is warm and dry.  Neurological:     Mental Status: She is alert and oriented to person, place, and time.     Cranial Nerves: No cranial nerve deficit.  Psychiatric:        Mood and Affect: Mood normal.        Behavior: Behavior normal.     Lab Results  Component Value Date   WBC 7.3 05/25/2020   HGB 13.5 05/25/2020   HCT 41.3 05/25/2020   PLT 328 05/25/2020   GLUCOSE 112 (H) 05/25/2020   CHOL 183 05/25/2020   TRIG 200 (H) 05/25/2020   HDL 31 (L) 05/25/2020   LDLCALC 117 (H) 05/25/2020   ALT 16 05/25/2020   AST 19 05/25/2020   NA 141 05/25/2020   K 4.8 05/25/2020   CL 102 05/25/2020   CREATININE 0.69 05/25/2020   BUN 11 05/25/2020   CO2 24 05/25/2020   TSH 0.579 05/25/2020      Assessment & Plan:  1. Routine medical exam Fairly healthy. Continue to work on eating health  And increase exercise.  Refuses vaccines, but will discuss shingrix vaccine with pharmacy.  2. Screening mammogram for breast cancer - MM DIGITAL SCREENING BILATERAL  3. Screening for cervical cancer - IGP, Aptima HPV, rfx 16/18,45  4. Attention deficit hyperactivity disorder (ADHD), predominantly inattentive type -  amphetamine-dextroamphetamine (ADDERALL) 15 MG tablet; Take 1 tablet by mouth 2 (two) times daily.  Dispense: 60 tablet; Refill: 0  5. Impaired fasting glucose - Hemoglobin A1c    Body mass index is 36.4 kg/m.   These are the goals we discussed: Goals   None      This is a list of the screening recommended for you and due dates:  Health Maintenance  Topic Date Due  .  Hepatitis C: One time screening is recommended by Center for Disease Control  (CDC) for  adults born from [birth year] through 1963.   Never done  . HIV Screening  Never done  . Tetanus Vaccine  Never done  . Pap Smear  Never done  . Mammogram  Never done  . Flu Shot  06/24/2020*  . Cologuard (Stool DNA test)  07/07/2022  . COVID-19 Vaccine  Completed  . HPV Vaccine  Aged Out  *Topic was postponed. The date shown is not the original due date.     AN INDIVIDUALIZED CARE PLAN: was established or reinforced today.   SELF MANAGEMENT: The patient and I together assessed ways to personally work towards obtaining the recommended goals  Support needs The patient and/or family needs were assessed and services were offered if appropriate.  Meds ordered this encounter  Medications  . amphetamine-dextroamphetamine (ADDERALL) 15 MG tablet    Sig: Take 1 tablet by mouth 2 (two) times daily.    Dispense:  60 tablet    Refill:  0    Follow-up: Return in about 3 months (around 09/03/2020) for fasting.  An After Visit Summary was printed and given to the patient.  Blane Ohara, MD [facility] [phone]

## 2020-06-03 ENCOUNTER — Encounter: Payer: Self-pay | Admitting: Family Medicine

## 2020-06-03 ENCOUNTER — Ambulatory Visit (INDEPENDENT_AMBULATORY_CARE_PROVIDER_SITE_OTHER): Payer: BC Managed Care – PPO | Admitting: Family Medicine

## 2020-06-03 ENCOUNTER — Other Ambulatory Visit: Payer: Self-pay

## 2020-06-03 VITALS — BP 122/82 | HR 82 | Temp 96.9°F | Ht 62.0 in | Wt 199.0 lb

## 2020-06-03 DIAGNOSIS — F900 Attention-deficit hyperactivity disorder, predominantly inattentive type: Secondary | ICD-10-CM

## 2020-06-03 DIAGNOSIS — Z1231 Encounter for screening mammogram for malignant neoplasm of breast: Secondary | ICD-10-CM | POA: Diagnosis not present

## 2020-06-03 DIAGNOSIS — Z124 Encounter for screening for malignant neoplasm of cervix: Secondary | ICD-10-CM | POA: Diagnosis not present

## 2020-06-03 DIAGNOSIS — R7301 Impaired fasting glucose: Secondary | ICD-10-CM

## 2020-06-03 DIAGNOSIS — Z0000 Encounter for general adult medical examination without abnormal findings: Secondary | ICD-10-CM

## 2020-06-03 LAB — HEMOGLOBIN A1C
Est. average glucose Bld gHb Est-mCnc: 114 mg/dL
Hgb A1c MFr Bld: 5.6 % (ref 4.8–5.6)

## 2020-06-03 MED ORDER — AMPHETAMINE-DEXTROAMPHETAMINE 15 MG PO TABS: 1.0000 | ORAL_TABLET | Freq: Two times a day (BID) | ORAL | 0 refills | Status: DC

## 2020-06-03 NOTE — Patient Instructions (Signed)
Preventive Care 84-58 Years Old, Female Preventive care refers to lifestyle choices and visits with your health care provider that can promote health and wellness. This includes:  A yearly physical exam. This is also called an annual wellness visit.  Regular dental and eye exams.  Immunizations.  Screening for certain conditions.  Healthy lifestyle choices, such as: ? Eating a healthy diet. ? Getting regular exercise. ? Not using drugs or products that contain nicotine and tobacco. ? Limiting alcohol use. What can I expect for my preventive care visit? Physical exam Your health care provider will check your:  Height and weight. These may be used to calculate your BMI (body mass index). BMI is a measurement that tells if you are at a healthy weight.  Heart rate and blood pressure.  Body temperature.  Skin for abnormal spots. Counseling Your health care provider may ask you questions about your:  Past medical problems.  Family's medical history.  Alcohol, tobacco, and drug use.  Emotional well-being.  Home life and relationship well-being.  Sexual activity.  Diet, exercise, and sleep habits.  Work and work Statistician.  Access to firearms.  Method of birth control.  Menstrual cycle.  Pregnancy history. What immunizations do I need? Vaccines are usually given at various ages, according to a schedule. Your health care provider will recommend vaccines for you based on your age, medical history, and lifestyle or other factors, such as travel or where you work.   What tests do I need? Blood tests  Lipid and cholesterol levels. These may be checked every 5 years, or more often if you are over 3 years old.  Hepatitis C test.  Hepatitis B test. Screening  Lung cancer screening. You may have this screening every year starting at age 73 if you have a 30-pack-year history of smoking and currently smoke or have quit within the past 15 years.  Colorectal cancer  screening. ? All adults should have this screening starting at age 52 and continuing until age 17. ? Your health care provider may recommend screening at age 49 if you are at increased risk. ? You will have tests every 1-10 years, depending on your results and the type of screening test.  Diabetes screening. ? This is done by checking your blood sugar (glucose) after you have not eaten for a while (fasting). ? You may have this done every 1-3 years.  Mammogram. ? This may be done every 1-2 years. ? Talk with your health care provider about when you should start having regular mammograms. This may depend on whether you have a family history of breast cancer.  BRCA-related cancer screening. This may be done if you have a family history of breast, ovarian, tubal, or peritoneal cancers.  Pelvic exam and Pap test. ? This may be done every 3 years starting at age 10. ? Starting at age 11, this may be done every 5 years if you have a Pap test in combination with an HPV test. Other tests  STD (sexually transmitted disease) testing, if you are at risk.  Bone density scan. This is done to screen for osteoporosis. You may have this scan if you are at high risk for osteoporosis. Talk with your health care provider about your test results, treatment options, and if necessary, the need for more tests. Follow these instructions at home: Eating and drinking  Eat a diet that includes fresh fruits and vegetables, whole grains, lean protein, and low-fat dairy products.  Take vitamin and mineral supplements  as recommended by your health care provider.  Do not drink alcohol if: ? Your health care provider tells you not to drink. ? You are pregnant, may be pregnant, or are planning to become pregnant.  If you drink alcohol: ? Limit how much you have to 0-1 drink a day. ? Be aware of how much alcohol is in your drink. In the [place] one drink equals one 12 oz bottle of beer (355 mL), one 5 oz glass of  wine (148 mL), or one 1 oz glass of hard liquor (44 mL).   Lifestyle  Take daily care of your teeth and gums. Brush your teeth every morning and night with fluoride toothpaste. Floss one time each day.  Stay active. Exercise for at least 30 minutes 5 or more days each week.  Do not use any products that contain nicotine or tobacco, such as cigarettes, e-cigarettes, and chewing tobacco. If you need help quitting, ask your health care provider.  Do not use drugs.  If you are sexually active, practice safe sex. Use a condom or other form of protection to prevent STIs (sexually transmitted infections).  If you do not wish to become pregnant, use a form of birth control. If you plan to become pregnant, see your health care provider for a prepregnancy visit.  If told by your health care provider, take low-dose aspirin daily starting at age 50.  Find healthy ways to cope with stress, such as: ? Meditation, yoga, or listening to music. ? Journaling. ? Talking to a trusted person. ? Spending time with friends and family. Safety  Always wear your seat belt while driving or riding in a vehicle.  Do not drive: ? If you have been drinking alcohol. Do not ride with someone who has been drinking. ? When you are tired or distracted. ? While texting.  Wear a helmet and other protective equipment during sports activities.  If you have firearms in your house, make sure you follow all gun safety procedures. What's next?  Visit your health care provider once a year for an annual wellness visit.  Ask your health care provider how often you should have your eyes and teeth checked.  Stay up to date on all vaccines. This information is not intended to replace advice given to you by your health care provider. Make sure you discuss any questions you have with your health care provider. Document Revised: 12/16/2019 Document Reviewed: 11/22/2017 Elsevier Patient Education  2021 Elsevier Inc.  

## 2020-06-08 LAB — IGP, APTIMA HPV, RFX 16/18,45
HPV Aptima: NEGATIVE
PAP Smear Comment: 0

## 2020-06-29 ENCOUNTER — Telehealth: Payer: Self-pay

## 2020-06-29 NOTE — Telephone Encounter (Signed)
Left detailed message that mammogram was scheduled at [facility] for June 3 @ 9:30 check-in.

## 2020-07-13 ENCOUNTER — Other Ambulatory Visit: Payer: Self-pay | Admitting: Family Medicine

## 2020-07-13 DIAGNOSIS — F900 Attention-deficit hyperactivity disorder, predominantly inattentive type: Secondary | ICD-10-CM

## 2020-07-13 MED ORDER — AMPHETAMINE-DEXTROAMPHETAMINE 15 MG PO TABS: 1.0000 | ORAL_TABLET | Freq: Two times a day (BID) | ORAL | 0 refills | Status: DC

## 2020-07-28 ENCOUNTER — Other Ambulatory Visit: Payer: Self-pay | Admitting: Family Medicine

## 2020-07-28 DIAGNOSIS — R7989 Other specified abnormal findings of blood chemistry: Secondary | ICD-10-CM

## 2020-08-08 ENCOUNTER — Other Ambulatory Visit: Payer: Self-pay | Admitting: Family Medicine

## 2020-08-08 DIAGNOSIS — E034 Atrophy of thyroid (acquired): Secondary | ICD-10-CM

## 2020-08-29 ENCOUNTER — Other Ambulatory Visit: Payer: Self-pay | Admitting: Family Medicine

## 2020-08-29 DIAGNOSIS — F900 Attention-deficit hyperactivity disorder, predominantly inattentive type: Secondary | ICD-10-CM

## 2020-08-30 MED ORDER — AMPHETAMINE-DEXTROAMPHETAMINE 15 MG PO TABS: 1.0000 | ORAL_TABLET | Freq: Two times a day (BID) | ORAL | 0 refills | Status: DC

## 2020-09-07 ENCOUNTER — Ambulatory Visit: Payer: BC Managed Care – PPO | Admitting: Family Medicine

## 2020-09-29 ENCOUNTER — Other Ambulatory Visit: Payer: Self-pay | Admitting: Family Medicine

## 2020-09-29 DIAGNOSIS — F900 Attention-deficit hyperactivity disorder, predominantly inattentive type: Secondary | ICD-10-CM

## 2020-10-01 ENCOUNTER — Other Ambulatory Visit: Payer: Self-pay | Admitting: Family Medicine

## 2020-10-01 DIAGNOSIS — F900 Attention-deficit hyperactivity disorder, predominantly inattentive type: Secondary | ICD-10-CM

## 2020-10-04 MED ORDER — AMPHETAMINE-DEXTROAMPHETAMINE 15 MG PO TABS: 1.0000 | ORAL_TABLET | Freq: Two times a day (BID) | ORAL | 0 refills | Status: DC

## 2020-10-04 MED ORDER — AMPHETAMINE-DEXTROAMPHETAMINE 15 MG PO TABS
1.0000 | ORAL_TABLET | Freq: Two times a day (BID) | ORAL | 0 refills | Status: DC
Start: 2020-10-04 — End: 2020-11-18

## 2020-10-13 ENCOUNTER — Other Ambulatory Visit: Payer: Self-pay | Admitting: Family Medicine

## 2020-10-13 DIAGNOSIS — R7989 Other specified abnormal findings of blood chemistry: Secondary | ICD-10-CM

## 2020-11-18 ENCOUNTER — Other Ambulatory Visit: Payer: Self-pay | Admitting: Family Medicine

## 2020-11-18 DIAGNOSIS — F900 Attention-deficit hyperactivity disorder, predominantly inattentive type: Secondary | ICD-10-CM

## 2020-11-18 MED ORDER — AMPHETAMINE-DEXTROAMPHETAMINE 15 MG PO TABS: 1.0000 | ORAL_TABLET | Freq: Two times a day (BID) | ORAL | 0 refills | Status: DC

## 2020-11-18 NOTE — Telephone Encounter (Signed)
controlled 

## 2020-12-22 ENCOUNTER — Other Ambulatory Visit: Payer: Self-pay | Admitting: Family Medicine

## 2020-12-22 ENCOUNTER — Encounter: Payer: Self-pay | Admitting: Family Medicine

## 2020-12-22 DIAGNOSIS — M791 Myalgia, unspecified site: Secondary | ICD-10-CM

## 2020-12-22 DIAGNOSIS — F900 Attention-deficit hyperactivity disorder, predominantly inattentive type: Secondary | ICD-10-CM

## 2020-12-22 DIAGNOSIS — R7989 Other specified abnormal findings of blood chemistry: Secondary | ICD-10-CM

## 2020-12-23 ENCOUNTER — Other Ambulatory Visit: Payer: Self-pay

## 2020-12-23 DIAGNOSIS — N3941 Urge incontinence: Secondary | ICD-10-CM

## 2020-12-23 MED ORDER — OXYBUTYNIN CHLORIDE ER 15 MG PO TB24: 15.0000 mg | ORAL_TABLET | Freq: Every day | ORAL | 0 refills | Status: DC

## 2020-12-23 NOTE — Telephone Encounter (Signed)
Pt sent mychart message requesting medication. Reply message was sent to pt to inform her she is overdue for an appointment. Requested pt call and make an appointment for follow up.   Will pend short supply.   Terrill Mohr 12/23/20 7:38 AM

## 2020-12-24 ENCOUNTER — Other Ambulatory Visit: Payer: Self-pay

## 2020-12-24 NOTE — Telephone Encounter (Signed)
Error.   Lorita Officer, CCMA 12/24/20 10:55 AM

## 2020-12-28 ENCOUNTER — Encounter: Payer: Self-pay | Admitting: Family Medicine

## 2020-12-28 ENCOUNTER — Other Ambulatory Visit: Payer: Self-pay

## 2020-12-28 ENCOUNTER — Ambulatory Visit: Payer: BC Managed Care – PPO | Admitting: Family Medicine

## 2020-12-28 VITALS — BP 126/78 | HR 72 | Temp 97.5°F | Resp 18 | Ht 62.0 in | Wt 201.2 lb

## 2020-12-28 DIAGNOSIS — E034 Atrophy of thyroid (acquired): Secondary | ICD-10-CM | POA: Diagnosis not present

## 2020-12-28 DIAGNOSIS — M791 Myalgia, unspecified site: Secondary | ICD-10-CM | POA: Diagnosis not present

## 2020-12-28 DIAGNOSIS — E782 Mixed hyperlipidemia: Secondary | ICD-10-CM | POA: Insufficient documentation

## 2020-12-28 DIAGNOSIS — E559 Vitamin D deficiency, unspecified: Secondary | ICD-10-CM

## 2020-12-28 DIAGNOSIS — F900 Attention-deficit hyperactivity disorder, predominantly inattentive type: Secondary | ICD-10-CM | POA: Diagnosis not present

## 2020-12-28 DIAGNOSIS — N3941 Urge incontinence: Secondary | ICD-10-CM

## 2020-12-28 DIAGNOSIS — E66812 Obesity, class 2: Secondary | ICD-10-CM

## 2020-12-28 DIAGNOSIS — Z6836 Body mass index (BMI) 36.0-36.9, adult: Secondary | ICD-10-CM | POA: Diagnosis not present

## 2020-12-28 MED ORDER — AMPHETAMINE-DEXTROAMPHETAMINE 15 MG PO TABS: 1.0000 | ORAL_TABLET | Freq: Two times a day (BID) | ORAL | 0 refills | Status: DC

## 2020-12-28 MED ORDER — OXYBUTYNIN CHLORIDE ER 15 MG PO TB24: 15.0000 mg | ORAL_TABLET | Freq: Every day | ORAL | 1 refills | Status: DC

## 2020-12-28 MED ORDER — MELOXICAM 7.5 MG PO TABS: 7.5000 mg | ORAL_TABLET | Freq: Two times a day (BID) | ORAL | 1 refills | Status: DC

## 2020-12-28 NOTE — Assessment & Plan Note (Signed)
Continue ditropan. Well controlled.  Side effect of dry mouth tolerable.

## 2020-12-28 NOTE — Assessment & Plan Note (Signed)
Check mg and phos.

## 2020-12-28 NOTE — Patient Instructions (Signed)
Heart-Healthy Eating Plan ?Heart-healthy meal planning includes: ?Eating less unhealthy fats. ?Eating more healthy fats. ?Making other changes in your diet. ?Talk with your doctor or a diet specialist (dietitian) to create an eating plan that is right for you. ? ?What are tips for following this plan? ?Cooking ?Avoid frying your food. Try to bake, boil, grill, or broil it instead. You can also reduce fat by: ?Removing the skin from poultry. ?Removing all visible fats from meats. ?Steaming vegetables in water or broth. ?Meal planning ? ?At meals, divide your plate into four equal parts: ?Fill one-half of your plate with vegetables and green salads. ?Fill one-fourth of your plate with whole grains. ?Fill one-fourth of your plate with lean protein foods. ?Eat 4-5 servings of vegetables per day. A serving of vegetables is: ?1 cup of raw or cooked vegetables. ?2 cups of raw leafy greens. ?Eat 4-5 servings of fruit per day. A serving of fruit is: ?1 medium whole fruit. ?? cup of dried fruit. ?? cup of fresh, frozen, or canned fruit. ?? cup of 100% fruit juice. ?Eat more foods that have soluble fiber. These are apples, broccoli, carrots, beans, peas, and barley. Try to get 20-30 g of fiber per day. ?Eat 4-5 servings of nuts, legumes, and seeds per week: ?1 serving of dried beans or legumes equals ? cup after being cooked. ?1 serving of nuts is ? cup. ?1 serving of seeds equals 1 tablespoon. ?General information ?Eat more home-cooked food. Eat less restaurant, buffet, and fast food. ?Limit or avoid alcohol. ?Limit foods that are high in starch and sugar. ?Avoid fried foods. ?Lose weight if you are overweight. ?Keep track of how much salt (sodium) you eat. This is important if you have high blood pressure. Ask your doctor to tell you more about this. ?Try to add vegetarian meals each week. ?Fats ?Choose healthy fats. These include olive oil and canola oil, flaxseeds, walnuts, almonds, and seeds. ?Eat more omega-3 fats. These  include salmon, mackerel, sardines, tuna, flaxseed oil, and ground flaxseeds. Try to eat fish at least 2 times each week. ?Check food labels. Avoid foods with trans fats or high amounts of saturated fat. ?Limit saturated fats. ?These are often found in animal products, such as meats, butter, and cream. ?These are also found in plant foods, such as palm oil, palm kernel oil, and coconut oil. ?Avoid foods with partially hydrogenated oils in them. These have trans fats. Examples are stick margarine, some tub margarines, cookies, crackers, and other baked goods. ?What foods can I eat? ?Fruits ?All fresh, canned (in natural juice), or frozen fruits. ?Vegetables ?Fresh or frozen vegetables (raw, steamed, roasted, or grilled). Green salads. ?Grains ?Most grains. Choose whole wheat and whole grains most of the time. Rice and pasta, including brown rice and pastas made with whole wheat. ?Meats and other proteins ?Lean, well-trimmed beef, veal, pork, and lamb. Chicken and turkey without skin. All fish and shellfish. Wild duck, rabbit, pheasant, and venison. Egg whites or low-cholesterol egg substitutes. Dried beans, peas, lentils, and tofu. Seeds and most nuts. ?Dairy ?Low-fat or nonfat cheeses, including ricotta and mozzarella. Skim or 1% milk that is liquid, powdered, or evaporated. Buttermilk that is made with low-fat milk. Nonfat or low-fat yogurt. ?Fats and oils ?Non-hydrogenated (trans-free) margarines. Vegetable oils, including soybean, sesame, sunflower, olive, peanut, safflower, corn, canola, and cottonseed. Salad dressings or mayonnaise made with a vegetable oil. ?Beverages ?Mineral water. Coffee and tea. Diet carbonated beverages. ?Sweets and desserts ?Sherbet, gelatin, and fruit ice. Small amounts   of dark chocolate. ?Limit all sweets and desserts. ?Seasonings and condiments ?All seasonings and condiments. ?The items listed above may not be a complete list of foods and drinks you can eat. Contact a dietitian for  more options. ?What foods should I avoid? ?Fruits ?Canned fruit in heavy syrup. Fruit in cream or butter sauce. Fried fruit. Limit coconut. ?Vegetables ?Vegetables cooked in cheese, cream, or butter sauce. Fried vegetables. ?Grains ?Breads that are made with saturated or trans fats, oils, or whole milk. Croissants. Sweet rolls. Donuts. High-fat crackers, such as cheese crackers. ?Meats and other proteins ?Fatty meats, such as hot dogs, ribs, sausage, bacon, rib-eye roast or steak. High-fat deli meats, such as salami and bologna. Caviar. Domestic duck and goose. Organ meats, such as liver. ?Dairy ?Cream, sour cream, cream cheese, and creamed cottage cheese. Whole-milk cheeses. Whole or 2% milk that is liquid, evaporated, or condensed. Whole buttermilk. Cream sauce or high-fat cheese sauce. Yogurt that is made from whole milk. ?Fats and oils ?Meat fat, or shortening. Cocoa butter, hydrogenated oils, palm oil, coconut oil, palm kernel oil. Solid fats and shortenings, including bacon fat, salt pork, lard, and butter. Nondairy cream substitutes. Salad dressings with cheese or sour cream. ?Beverages ?Regular sodas and juice drinks with added sugar. ?Sweets and desserts ?Frosting. Pudding. Cookies. Cakes. Pies. Milk chocolate or white chocolate. Buttered syrups. Full-fat ice cream or ice cream drinks. ?The items listed above may not be a complete list of foods and drinks to avoid. Contact a dietitian for more information. ?Summary ?Heart-healthy meal planning includes eating less unhealthy fats, eating more healthy fats, and making other changes in your diet. ?Eat a balanced diet. This includes fruits and vegetables, low-fat or nonfat dairy, lean protein, nuts and legumes, whole grains, and heart-healthy oils and fats. ?This information is not intended to replace advice given to you by your health care provider. Make sure you discuss any questions you have with your health care provider. ?Document Revised: 07/22/2020  Document Reviewed: 07/22/2020 ?Elsevier Patient Education ? 2022 Elsevier Inc. ? ?

## 2020-12-28 NOTE — Progress Notes (Signed)
Subjective:  Patient ID: Ashley Colon, female    DOB: Nov 03, 1960  Age: 60 y.o. MRN: [id number]  Chief Complaint  Patient presents with   Hypothyroidism   ADHD    HPI ADD: ON adderall 15 mg one twice daily.  Vitamin D deficiency: Taking vitamin D once a week.  Hypothyroidism: Synthroid 137 mcg once daily.  Trigger Finger: kenalog helped, but still has pain. Takes Meloxicam 7.5 mg one to two times a day.   Urge Incontinence: Ditropan xl 15 mg once daily. Hyperlipidemia: on fish oil. Tries to eat healthy. Did not start crestor. Was scared because her Dad had muscle pain.   Current Outpatient Medications on File Prior to Visit  Medication Sig Dispense Refill   amphetamine-dextroamphetamine (ADDERALL) 15 MG tablet Take 1 tablet by mouth 2 (two) times daily. 60 tablet 0   EUTHYROX 137 MCG tablet Take 1 tablet by mouth once daily 90 tablet 3   meloxicam (MOBIC) 7.5 MG tablet Take 1 tablet by mouth twice daily 180 tablet 0   Multiple Vitamin (MULTIVITAMIN) tablet Take 1 tablet by mouth 2 (two) times daily.     oxybutynin (DITROPAN XL) 15 MG 24 hr tablet Take 1 tablet (15 mg total) by mouth daily. 30 tablet 0   Vitamin D, Ergocalciferol, (DRISDOL) 1.25 MG (50000 UNIT) CAPS capsule Take 1 capsule by mouth once a week 12 capsule 0   No current facility-administered medications on file prior to visit.   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   Overactive bladder 04/28/2019   Past Surgical History:  Procedure Laterality Date   CESAREAN SECTION  1999   CHOLECYSTECTOMY  1990   GASTRIC BYPASS  2008    Family History  Problem Relation Age of Onset   Pulmonary embolism Mother    Arthritis/Rheumatoid Mother    Coronary artery disease Father    Diverticulitis Father    Arthritis Father    Lung cancer Maternal Grandfather     Breast cancer Maternal Aunt    Social History   Socioeconomic History   Marital status: Married    Spouse name: Not on file   Number of children: 1   Years of education: Not on file   Highest education level: Not on file  Occupational History   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, nature  Tobacco Use   Smoking status: Never   Smokeless tobacco: Never  Vaping Use   Vaping Use: Never used  Substance and Sexual Activity   Alcohol use: Yes    Comment: rare   Drug use: Never   Sexual activity: Not Currently  Other Topics Concern   Not on file  Social History Narrative   Not on file   Social Determinants of Health   Financial Resource Strain: Not on file  Food Insecurity: Not on file  Transportation Needs: Not on file  Physical Activity: Not on file  Stress: Not on file  Social Connections: Not on file    Review of Systems  Constitutional:  Negative for chills, fatigue and fever.  HENT:  Negative for congestion, ear pain and sore throat.   Respiratory:  Negative for cough and shortness of breath.   Cardiovascular:  Negative for chest pain.  Gastrointestinal:  Negative for abdominal pain, constipation, diarrhea, nausea and vomiting.  Genitourinary:  Negative for dysuria and frequency.  Musculoskeletal:  Negative for arthralgias and myalgias.  Skin:  Negative for rash.  Neurological:  Negative for headaches.  Psychiatric/Behavioral:  Negative for dysphoric mood. The patient is not nervous/anxious.     Objective:  BP 126/78   Pulse 72   Temp (!) 97.5 F (36.4 C)   Resp 18   Ht 5\' 2"  (1.575 m)   Wt 201 lb 3.2 oz (91.3 kg)   SpO2 98%   BMI 36.80 kg/m   BP/Weight 12/28/2020 06/03/2020 05/25/2020  Systolic BP 126 122 136  Diastolic BP 78 82 82  Wt. (Lbs) 201.2 199 199  BMI 36.8 36.4 36.4    Physical Exam Constitutional:      Appearance: Normal appearance. She is normal weight.  Cardiovascular:     Rate and Rhythm: Normal rate and regular rhythm.     Heart sounds: Normal  heart sounds.  Pulmonary:     Effort: Pulmonary effort is normal.     Breath sounds: Normal breath sounds.  Abdominal:     General: Bowel sounds are normal.     Palpations: Abdomen is soft.     Tenderness: There is no abdominal tenderness.  Neurological:     Mental Status: She is oriented to person, place, and time. Mental status is at baseline.  Psychiatric:        Mood and Affect: Mood normal.        Behavior: Behavior normal.    Diabetic Foot Exam - Simple   No data filed      Lab Results  Component Value Date   WBC 7.3 05/25/2020   HGB 13.5 05/25/2020   HCT 41.3 05/25/2020   PLT 328 05/25/2020   GLUCOSE 112 (H) 05/25/2020   CHOL 183 05/25/2020   TRIG 200 (H) 05/25/2020   HDL 31 (L) 05/25/2020   LDLCALC 117 (H) 05/25/2020   ALT 16 05/25/2020   AST 19 05/25/2020   NA 141 05/25/2020   K 4.8 05/25/2020   CL 102 05/25/2020   CREATININE 0.69 05/25/2020   BUN 11 05/25/2020   CO2 24 05/25/2020   TSH 0.579 05/25/2020   HGBA1C 5.6 06/03/2020      Assessment & Plan:   Problem List Items Addressed This Visit       Endocrine   Hypothyroidism due to acquired atrophy of thyroid - Primary   Relevant Orders   TSH     Other   Mixed hyperlipidemia   Vitamin D insufficiency   Morbid obesity (HCC)   BMI 36.0-36.9,adult   Relevant Orders   CBC with Differential   Comprehensive metabolic panel   Lipid Panel   Attention deficit hyperactivity disorder (ADHD), predominantly inattentive type  .  No orders of the defined types were placed in this encounter.   Orders Placed This Encounter  Procedures   CBC with Differential   Comprehensive metabolic panel   TSH   Lipid Panel      Follow-up: Return in about 5 months (around 06/06/2021) for fasting , cpe.  An After Visit Summary was printed and given to the patient.  06/08/2021, MD [facility] [phone]

## 2020-12-28 NOTE — Assessment & Plan Note (Signed)
Well controlled.  Continue adderall 15 mg one twice a day.

## 2020-12-28 NOTE — Assessment & Plan Note (Signed)
Adjust synthroid after tsh cames back.

## 2020-12-28 NOTE — Assessment & Plan Note (Signed)
Recommend low fat diet.  Recommend exercise.  Consider statin medicine with coenzyme q 10.  Continue fish oil.

## 2020-12-28 NOTE — Assessment & Plan Note (Signed)
Check level and adjust medicine accordingly.

## 2020-12-29 LAB — CBC WITH DIFFERENTIAL/PLATELET
Basophils Absolute: 0.1 10*3/uL (ref 0.0–0.2)
Basos: 1 %
EOS (ABSOLUTE): 0.3 10*3/uL (ref 0.0–0.4)
Eos: 4 %
Hematocrit: 41 % (ref 34.0–46.6)
Hemoglobin: 14.1 g/dL (ref 11.1–15.9)
Immature Grans (Abs): 0 10*3/uL (ref 0.0–0.1)
Immature Granulocytes: 0 %
Lymphocytes Absolute: 2.8 10*3/uL (ref 0.7–3.1)
Lymphs: 33 %
MCH: 29.3 pg (ref 26.6–33.0)
MCHC: 34.4 g/dL (ref 31.5–35.7)
MCV: 85 fL (ref 79–97)
Monocytes Absolute: 0.6 10*3/uL (ref 0.1–0.9)
Monocytes: 7 %
Neutrophils Absolute: 4.7 10*3/uL (ref 1.4–7.0)
Neutrophils: 55 %
Platelets: 287 10*3/uL (ref 150–450)
RBC: 4.82 x10E6/uL (ref 3.77–5.28)
RDW: 13.6 % (ref 11.7–15.4)
WBC: 8.5 10*3/uL (ref 3.4–10.8)

## 2020-12-29 LAB — LIPID PANEL
Chol/HDL Ratio: 4.9 ratio — ABNORMAL HIGH (ref 0.0–4.4)
Cholesterol, Total: 167 mg/dL (ref 100–199)
HDL: 34 mg/dL — ABNORMAL LOW (ref >39)
LDL Chol Calc (NIH): 101 mg/dL — ABNORMAL HIGH (ref 0–99)
Triglycerides: 183 mg/dL — ABNORMAL HIGH (ref 0–149)
VLDL Cholesterol Cal: 32 mg/dL (ref 5–40)

## 2020-12-29 LAB — COMPREHENSIVE METABOLIC PANEL
ALT: 21 IU/L (ref 0–32)
AST: 27 IU/L (ref 0–40)
Albumin/Globulin Ratio: 1.6 (ref 1.2–2.2)
Albumin: 4.1 g/dL (ref 3.8–4.9)
Alkaline Phosphatase: 98 IU/L (ref 44–121)
BUN/Creatinine Ratio: 16 (ref 9–23)
BUN: 10 mg/dL (ref 6–24)
Bilirubin Total: 0.5 mg/dL (ref 0.0–1.2)
CO2: 23 mmol/L (ref 20–29)
Calcium: 9.2 mg/dL (ref 8.7–10.2)
Chloride: 103 mmol/L (ref 96–106)
Creatinine, Ser: 0.64 mg/dL (ref 0.57–1.00)
Globulin, Total: 2.5 g/dL (ref 1.5–4.5)
Glucose: 97 mg/dL (ref 70–99)
Potassium: 4.8 mmol/L (ref 3.5–5.2)
Sodium: 144 mmol/L (ref 134–144)
Total Protein: 6.6 g/dL (ref 6.0–8.5)
eGFR: 103 mL/min/{1.73_m2} (ref >59)

## 2020-12-29 LAB — PHOSPHORUS: Phosphorus: 4.5 mg/dL — ABNORMAL HIGH (ref 3.0–4.3)

## 2020-12-29 LAB — TSH: TSH: 0.752 u[IU]/mL (ref 0.450–4.500)

## 2020-12-29 LAB — CARDIOVASCULAR RISK ASSESSMENT

## 2020-12-29 LAB — VITAMIN D 25 HYDROXY (VIT D DEFICIENCY, FRACTURES)

## 2020-12-29 LAB — MAGNESIUM: Magnesium: 2.4 mg/dL — ABNORMAL HIGH (ref 1.6–2.3)

## 2020-12-30 NOTE — Progress Notes (Signed)
Blood count normal.  Liver function normal.  Kidney function normal.  Thyroid function normal.  Cholesterol: LDL, Trigs, and HDL all improved, but still.  Magnesium and phosphorus were not low.  Unsure why she is having muscle spasms. Recommend push fluids.

## 2021-01-31 ENCOUNTER — Other Ambulatory Visit: Payer: Self-pay | Admitting: Family Medicine

## 2021-01-31 DIAGNOSIS — F900 Attention-deficit hyperactivity disorder, predominantly inattentive type: Secondary | ICD-10-CM

## 2021-01-31 MED ORDER — AMPHETAMINE-DEXTROAMPHETAMINE 15 MG PO TABS: 1.0000 | ORAL_TABLET | Freq: Two times a day (BID) | ORAL | 0 refills | Status: DC

## 2021-02-28 ENCOUNTER — Other Ambulatory Visit: Payer: Self-pay | Admitting: Family Medicine

## 2021-02-28 DIAGNOSIS — F900 Attention-deficit hyperactivity disorder, predominantly inattentive type: Secondary | ICD-10-CM

## 2021-02-28 MED ORDER — AMPHETAMINE-DEXTROAMPHETAMINE 15 MG PO TABS
1.0000 | ORAL_TABLET | Freq: Two times a day (BID) | ORAL | 0 refills | Status: DC
Start: 2021-02-28 — End: 2021-04-06

## 2021-04-06 ENCOUNTER — Other Ambulatory Visit: Payer: Self-pay | Admitting: Family Medicine

## 2021-04-06 DIAGNOSIS — F900 Attention-deficit hyperactivity disorder, predominantly inattentive type: Secondary | ICD-10-CM

## 2021-04-06 MED ORDER — AMPHETAMINE-DEXTROAMPHETAMINE 15 MG PO TABS: 1.0000 | ORAL_TABLET | Freq: Two times a day (BID) | ORAL | 0 refills | Status: DC

## 2021-05-16 ENCOUNTER — Other Ambulatory Visit: Payer: Self-pay | Admitting: Family Medicine

## 2021-05-16 ENCOUNTER — Encounter: Payer: Self-pay | Admitting: Family Medicine

## 2021-05-16 DIAGNOSIS — F900 Attention-deficit hyperactivity disorder, predominantly inattentive type: Secondary | ICD-10-CM

## 2021-05-16 MED ORDER — AMPHETAMINE-DEXTROAMPHETAMINE 15 MG PO TABS: 1.0000 | ORAL_TABLET | Freq: Two times a day (BID) | ORAL | 0 refills | Status: DC

## 2021-05-30 NOTE — Progress Notes (Signed)
Subjective:  Patient ID: Ashley Colon, female    DOB: 1961-01-26  Age: 61 y.o. MRN: [id number]  Chief Complaint  Patient presents with   Annual Exam    HPI Well Adult Physical: Patient here for a comprehensive physical exam.The patient reports no problems Do you take any herbs or supplements that were not prescribed by a doctor? yes Are you taking calcium supplements? no Are you taking aspirin daily? no  Encounter for general adult medical examination without abnormal findings  Physical ("At Risk" items are starred): Patient's last physical exam was 1 year ago .  Patient is not afflicted from Stress Incontinence and Urge Incontinence  Patient wears a seat belt, has smoke detectors, has carbon monoxide detectors, practices appropriate gun safety, and wears sunscreen with extended sun exposure. Dental Care: biannual cleanings, brushes and flosses daily. Ophthalmology/Optometry: Annual visit.  Hearing loss: none Vision impairments: none  Menarche: 61 yo Menstrual History: 61 yo No vaginal bleeding.  Pregnancy history: G1P1001 Safe at home: yes Self breast exams: normal.   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, nature  Tobacco Use   Smoking status: Never   Smokeless tobacco: Never  Vaping Use   Vaping Use: Never used  Substance and Sexual Activity   Alcohol use: Yes    Comment: rare   Drug use: Never   Sexual activity: Not Currently  Other Topics Concern   Not on file  Social History Narrative   Not on file   Social Determinants of Health   Financial Resource Strain: Low Risk    Difficulty of Paying Living Expenses: Not hard at all  Food Insecurity: No Food  Insecurity   Worried About Programme researcher, broadcasting/film/video in the Last Year: Never true   Ran Out of Food in the Last Year: Never true  Transportation Needs: No Transportation Needs   Lack of Transportation (Medical): No   Lack of Transportation (Non-Medical): No  Physical Activity: Sufficiently Active   Days of Exercise per Week: 3 days   Minutes of Exercise per Session: 60 min  Stress: No Stress Concern Present   Feeling of Stress : Only a little  Social Connections: Moderately Isolated   Frequency of Communication with Friends and Family: More than three times a week   Frequency of Social Gatherings with Friends and Family: More than three times a week   Attends Religious Services: Never   Database administrator or Organizations: No   Attends Banker Meetings: Never   Marital Status: Married   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   Overactive bladder 04/28/2019   Past Surgical History:  Procedure Laterality Date   CESAREAN SECTION  1999   CHOLECYSTECTOMY  1990   GASTRIC BYPASS  2008    Family History  Problem Relation Age of Onset   Pulmonary embolism Mother    Arthritis/Rheumatoid Mother    Coronary artery disease Father    Diverticulitis Father    Arthritis  Father    Lung cancer Maternal Grandfather    Breast cancer Maternal Aunt     Review of Systems  Constitutional:  Negative for appetite change, fatigue and fever.  HENT:  Negative for congestion, ear pain, sinus pressure and sore throat.   Eyes:  Negative for pain.  Respiratory:  Negative for cough, chest tightness, shortness of breath and wheezing.   Cardiovascular:  Negative for chest pain and palpitations.  Gastrointestinal:  Negative for abdominal pain, constipation, diarrhea, nausea and vomiting.  Genitourinary:  Negative for dysuria and  hematuria.  Musculoskeletal:  Negative for arthralgias, back pain, joint swelling and myalgias.  Skin:  Negative for rash.  Neurological:  Positive for headaches. Negative for dizziness and weakness.  Psychiatric/Behavioral:  Negative for dysphoric mood. The patient is not nervous/anxious.     Objective:  BP (!) 144/100    Pulse 81    Temp (!) 96.9 F (36.1 C)    Resp 16    Ht 5\' 1"  (1.549 m)    Wt 201 lb (91.2 kg)    BMI 37.98 kg/m   BP/Weight 05/31/2021 12/28/2020 06/03/2020  Systolic BP 144 126 122  Diastolic BP 100 78 82  Wt. (Lbs) 201 201.2 199  BMI 37.98 36.8 36.4    Physical Exam Vitals reviewed.  Constitutional:      General: She is not in acute distress.    Appearance: Normal appearance. She is obese.  HENT:     Right Ear: Tympanic membrane, ear canal and external ear normal.     Left Ear: Tympanic membrane, ear canal and external ear normal.     Nose: Nose normal. No congestion or rhinorrhea.     Mouth/Throat:     Pharynx: No oropharyngeal exudate or posterior oropharyngeal erythema.  Eyes:     Conjunctiva/sclera: Conjunctivae normal.  Neck:     Thyroid: No thyroid mass.     Vascular: No carotid bruit.  Cardiovascular:     Rate and Rhythm: Normal rate and regular rhythm.     Pulses: Normal pulses.     Heart sounds: Normal heart sounds. No murmur heard. Pulmonary:     Effort: Pulmonary effort is normal. No respiratory distress.     Breath sounds: Normal breath sounds.  Chest:  Breasts:    Right: Normal.     Left: Normal.  Abdominal:     General: Abdomen is flat. Bowel sounds are normal.     Palpations: Abdomen is soft. There is no mass.     Tenderness: There is no abdominal tenderness.  Lymphadenopathy:     Cervical: No cervical adenopathy.     Upper Body:     Right upper body: No axillary adenopathy.     Left upper body: No axillary adenopathy.  Neurological:     Mental Status: She is alert and oriented to person, place, and time.     Cranial Nerves: No  cranial nerve deficit.  Psychiatric:        Mood and Affect: Mood normal.        Behavior: Behavior normal.    Lab Results  Component Value Date   WBC 8.5 12/28/2020   HGB 14.1 12/28/2020   HCT 41.0 12/28/2020   PLT 287 12/28/2020   GLUCOSE 97 12/28/2020   CHOL 167 12/28/2020   TRIG 183 (H) 12/28/2020   HDL 34 (L) 12/28/2020   LDLCALC 101 (H) 12/28/2020   ALT 21 12/28/2020   AST 27 12/28/2020   NA 144 12/28/2020  K 4.8 12/28/2020   CL 103 12/28/2020   CREATININE 0.64 12/28/2020   BUN 10 12/28/2020   CO2 23 12/28/2020   TSH 0.752 12/28/2020   HGBA1C 5.6 06/03/2020      Assessment & Plan:   Problem List Items Addressed This Visit       Endocrine   Hypothyroidism due to acquired atrophy of thyroid   Relevant Orders   TSH     Other   Attention deficit hyperactivity disorder (ADHD), predominantly inattentive type    Rx: adderal sent.       Relevant Medications   amphetamine-dextroamphetamine (ADDERALL) 15 MG tablet   Class 2 severe obesity due to excess calories with serious comorbidity and body mass index (BMI) of 37.0 to 37.9 in adult [facility])    Recommend continue to work on eating healthy diet and exercise.       Relevant Medications   amphetamine-dextroamphetamine (ADDERALL) 15 MG tablet   Encounter for general adult medical examination without abnormal findings - Primary    Things to do to keep yourself healthy  - Exercise at least 30-45 minutes a day, 3-4 days a week.  - Eat a low-fat diet with lots of fruits and vegetables, up to 7-9 servings per day.  - Seatbelts can save your life. Wear them always.  - Smoke detectors on every level of your home, check batteries every year.  - Eye Doctor - have an eye exam every 1-2 years  - Safe sex - if you may be exposed to STDs, use a condom.  - Alcohol -  If you drink, do it moderately, less than 2 drinks per day.  - Health Care Power of Attorney. Choose someone to speak for you if you are not able.  -  Depression is common in our stressful world.If you're feeling down or losing interest in things you normally enjoy, please come in for a visit.  - Violence - If anyone is threatening or hurting you, please call immediately.   Return in 2 months for Shingrix vaccine #2.      Relevant Orders   CBC With Diff/Platelet   Comprehensive metabolic panel   Lipid panel   Need for shingles vaccine    shingrix given.       Relevant Orders   Varicella-zoster vaccine IM (Shingrix) (Completed)   Elevated BP without diagnosis of hypertension    Check bp daily at home.  If bp above 140/90, patient should call with numbers or come back for a nurse visit in 1 week for bp check.      Visit for screening mammogram   Relevant Orders   MM DIGITAL SCREENING BILATERAL    This is a list of the screening recommended for you and due dates:  Health Maintenance  Topic Date Due   HIV Screening  Never done   Hepatitis C Screening: USPSTF Recommendation to screen - Ages 52-79 yo.  Never done   Tetanus Vaccine  Never done   Mammogram  08/31/2017   COVID-19 Vaccine (4 - Booster for Moderna series) 05/26/2020   Zoster (Shingles) Vaccine (2 of 2) 07/26/2021   Cologuard (Stool DNA test)  07/07/2022   Pap Smear  06/04/2023   HPV Vaccine  Aged Out   Flu Shot  Discontinued     Meds ordered this encounter  Medications   amphetamine-dextroamphetamine (ADDERALL) 15 MG tablet    Sig: Take 1 tablet by mouth 2 (two) times daily.    Dispense:  60 tablet  Refill:  0    Follow-up: Return in about 2 months (around 07/31/2021) for Nurse visit.  An After Visit Summary was printed and given to the patient.  Blane Ohara, MD [facility] [phone]

## 2021-05-31 ENCOUNTER — Ambulatory Visit (INDEPENDENT_AMBULATORY_CARE_PROVIDER_SITE_OTHER): Payer: BC Managed Care – PPO | Admitting: Family Medicine

## 2021-05-31 ENCOUNTER — Other Ambulatory Visit: Payer: Self-pay

## 2021-05-31 ENCOUNTER — Encounter: Payer: Self-pay | Admitting: Family Medicine

## 2021-05-31 VITALS — BP 144/100 | HR 81 | Temp 96.9°F | Resp 16 | Ht 61.0 in | Wt 201.0 lb

## 2021-05-31 DIAGNOSIS — R030 Elevated blood-pressure reading, without diagnosis of hypertension: Secondary | ICD-10-CM

## 2021-05-31 DIAGNOSIS — Z23 Encounter for immunization: Secondary | ICD-10-CM | POA: Diagnosis not present

## 2021-05-31 DIAGNOSIS — Z1231 Encounter for screening mammogram for malignant neoplasm of breast: Secondary | ICD-10-CM

## 2021-05-31 DIAGNOSIS — Z0000 Encounter for general adult medical examination without abnormal findings: Secondary | ICD-10-CM

## 2021-05-31 DIAGNOSIS — Z6837 Body mass index (BMI) 37.0-37.9, adult: Secondary | ICD-10-CM

## 2021-05-31 DIAGNOSIS — E034 Atrophy of thyroid (acquired): Secondary | ICD-10-CM | POA: Diagnosis not present

## 2021-05-31 DIAGNOSIS — F900 Attention-deficit hyperactivity disorder, predominantly inattentive type: Secondary | ICD-10-CM | POA: Diagnosis not present

## 2021-05-31 MED ORDER — AMPHETAMINE-DEXTROAMPHETAMINE 15 MG PO TABS: 1.0000 | ORAL_TABLET | Freq: Two times a day (BID) | ORAL | 0 refills | Status: DC

## 2021-05-31 NOTE — Patient Instructions (Signed)
Things to do to keep yourself healthy  ?- Exercise at least 30-45 minutes a day, 3-4 days a week.  ?- Eat a low-fat diet with lots of fruits and vegetables, up to 7-9 servings per day.  ?- Seatbelts can save your life. Wear them always.  ?- Smoke detectors on every level of your home, check batteries every year.  ?- Eye Doctor - have an eye exam every 1-2 years  ?- Safe sex - if you may be exposed to STDs, use a condom.  ?- Alcohol -  If you drink, do it moderately, less than 2 drinks per day.  ?- Health Care Power of Attorney. Choose someone to speak for you if you are not able.  ?- Depression is common in our stressful world.If you're feeling down or losing interest in things you normally enjoy, please come in for a visit.  ?- Violence - If anyone is threatening or hurting you, please call immediately.  ? ?Return in 2 months for Shingrix vaccine #2. ?

## 2021-05-31 NOTE — Assessment & Plan Note (Signed)
shingrix given

## 2021-05-31 NOTE — Assessment & Plan Note (Signed)
Check bp daily at home.  ?If bp above 140/90, patient should call with numbers or come back for a nurse visit in 1 week for bp check. ?

## 2021-05-31 NOTE — Assessment & Plan Note (Signed)
Things to do to keep yourself healthy  ?- Exercise at least 30-45 minutes a day, 3-4 days a week.  ?- Eat a low-fat diet with lots of fruits and vegetables, up to 7-9 servings per day.  ?- Seatbelts can save your life. Wear them always.  ?- Smoke detectors on every level of your home, check batteries every year.  ?- Eye Doctor - have an eye exam every 1-2 years  ?- Safe sex - if you may be exposed to STDs, use a condom.  ?- Alcohol -  If you drink, do it moderately, less than 2 drinks per day.  ?- Health Care Power of [place]. Choose someone to speak for you if you are not able.  ?- Depression is common in our stressful world.If you're feeling down or losing interest in things you normally enjoy, please come in for a visit.  ?- Violence - If anyone is threatening or hurting you, please call immediately.  ? ?Return in 2 months for Shingrix vaccine #2. ?

## 2021-05-31 NOTE — Assessment & Plan Note (Signed)
Recommend continue to work on eating healthy diet and exercise.  

## 2021-05-31 NOTE — Assessment & Plan Note (Signed)
Rx: adderal sent.  ?

## 2021-06-01 ENCOUNTER — Encounter: Payer: Self-pay | Admitting: Family Medicine

## 2021-06-01 LAB — COMPREHENSIVE METABOLIC PANEL
ALT: 26 IU/L (ref 0–32)
AST: 22 IU/L (ref 0–40)
Albumin/Globulin Ratio: 1.8 (ref 1.2–2.2)
Albumin: 4.3 g/dL (ref 3.8–4.9)
Alkaline Phosphatase: 85 IU/L (ref 44–121)
BUN/Creatinine Ratio: 18 (ref 9–23)
BUN: 13 mg/dL (ref 6–24)
Bilirubin Total: 0.4 mg/dL (ref 0.0–1.2)
CO2: 26 mmol/L (ref 20–29)
Calcium: 9.3 mg/dL (ref 8.7–10.2)
Chloride: 101 mmol/L (ref 96–106)
Creatinine, Ser: 0.74 mg/dL (ref 0.57–1.00)
Globulin, Total: 2.4 g/dL (ref 1.5–4.5)
Glucose: 99 mg/dL (ref 70–99)
Potassium: 4.8 mmol/L (ref 3.5–5.2)
Sodium: 140 mmol/L (ref 134–144)
Total Protein: 6.7 g/dL (ref 6.0–8.5)
eGFR: 94 mL/min/{1.73_m2} (ref >59)

## 2021-06-01 LAB — CBC WITH DIFF/PLATELET
Basophils Absolute: 0.1 10*3/uL (ref 0.0–0.2)
Basos: 1 %
EOS (ABSOLUTE): 0.4 10*3/uL (ref 0.0–0.4)
Eos: 5 %
Hematocrit: 43.5 % (ref 34.0–46.6)
Hemoglobin: 14.2 g/dL (ref 11.1–15.9)
Immature Grans (Abs): 0 10*3/uL (ref 0.0–0.1)
Immature Granulocytes: 0 %
Lymphocytes Absolute: 2.5 10*3/uL (ref 0.7–3.1)
Lymphs: 32 %
MCH: 27.7 pg (ref 26.6–33.0)
MCHC: 32.6 g/dL (ref 31.5–35.7)
MCV: 85 fL (ref 79–97)
Monocytes Absolute: 0.5 10*3/uL (ref 0.1–0.9)
Monocytes: 7 %
Neutrophils Absolute: 4.2 10*3/uL (ref 1.4–7.0)
Neutrophils: 55 %
Platelets: 347 10*3/uL (ref 150–450)
RBC: 5.13 x10E6/uL (ref 3.77–5.28)
RDW: 13.4 % (ref 11.7–15.4)
WBC: 7.6 10*3/uL (ref 3.4–10.8)

## 2021-06-01 LAB — LIPID PANEL
Chol/HDL Ratio: 5.9 ratio — ABNORMAL HIGH (ref 0.0–4.4)
Cholesterol, Total: 170 mg/dL (ref 100–199)
HDL: 29 mg/dL — ABNORMAL LOW (ref >39)
LDL Chol Calc (NIH): 101 mg/dL — ABNORMAL HIGH (ref 0–99)
Triglycerides: 231 mg/dL — ABNORMAL HIGH (ref 0–149)
VLDL Cholesterol Cal: 40 mg/dL (ref 5–40)

## 2021-06-01 LAB — CARDIOVASCULAR RISK ASSESSMENT

## 2021-06-01 LAB — TSH: TSH: 1.17 u[IU]/mL (ref 0.450–4.500)

## 2021-06-02 ENCOUNTER — Other Ambulatory Visit: Payer: Self-pay

## 2021-06-02 MED ORDER — ICOSAPENT ETHYL 1 G PO CAPS: 1.0000 g | ORAL_CAPSULE | Freq: Two times a day (BID) | ORAL | 0 refills | Status: DC

## 2021-06-07 ENCOUNTER — Ambulatory Visit (INDEPENDENT_AMBULATORY_CARE_PROVIDER_SITE_OTHER): Payer: BC Managed Care – PPO

## 2021-06-07 VITALS — BP 154/84

## 2021-06-07 DIAGNOSIS — I10 Essential (primary) hypertension: Secondary | ICD-10-CM | POA: Diagnosis not present

## 2021-06-07 DIAGNOSIS — R030 Elevated blood-pressure reading, without diagnosis of hypertension: Secondary | ICD-10-CM | POA: Diagnosis not present

## 2021-06-07 MED ORDER — LOSARTAN POTASSIUM 50 MG PO TABS: 50.0000 mg | ORAL_TABLET | Freq: Every day | ORAL | 1 refills | Status: DC

## 2021-06-07 NOTE — Progress Notes (Signed)
Patient came in today for a 1 week bp check, and patient's BP reading is still high. Dr. Sedalia Muta was consulted with and per Dr. Sedalia Muta patient is to start Losartan 50 mg 1 tablet daily and follow up in 3-4 weeks for a visit. Patient agrees with this new medication. ?

## 2021-06-15 ENCOUNTER — Other Ambulatory Visit: Payer: Self-pay | Admitting: Family Medicine

## 2021-06-15 DIAGNOSIS — Z1231 Encounter for screening mammogram for malignant neoplasm of breast: Secondary | ICD-10-CM

## 2021-06-22 ENCOUNTER — Other Ambulatory Visit: Payer: Self-pay

## 2021-06-22 ENCOUNTER — Ambulatory Visit
Admission: RE | Admit: 2021-06-22 | Discharge: 2021-06-22 | Disposition: A | Discharge status: Home / Self Care | Payer: BC Managed Care – PPO | Source: Ambulatory Visit | Attending: Family Medicine | Admitting: Family Medicine

## 2021-06-22 DIAGNOSIS — Z1231 Encounter for screening mammogram for malignant neoplasm of breast: Secondary | ICD-10-CM

## 2021-07-05 ENCOUNTER — Encounter: Payer: Self-pay | Admitting: Nurse Practitioner

## 2021-07-05 ENCOUNTER — Ambulatory Visit: Payer: BC Managed Care – PPO | Admitting: Nurse Practitioner

## 2021-07-05 VITALS — BP 168/98 | HR 83 | Temp 97.4°F | Ht 61.5 in | Wt 202.0 lb

## 2021-07-05 DIAGNOSIS — I10 Essential (primary) hypertension: Secondary | ICD-10-CM

## 2021-07-05 DIAGNOSIS — Z9884 Bariatric surgery status: Secondary | ICD-10-CM

## 2021-07-05 DIAGNOSIS — E559 Vitamin D deficiency, unspecified: Secondary | ICD-10-CM | POA: Diagnosis not present

## 2021-07-05 MED ORDER — VITAMIN D (ERGOCALCIFEROL) 1.25 MG (50000 UNIT) PO CAPS: 50000.0000 [IU] | ORAL_CAPSULE | ORAL | 3 refills | Status: DC

## 2021-07-05 MED ORDER — LOSARTAN POTASSIUM 100 MG PO TABS: 100.0000 mg | ORAL_TABLET | Freq: Every day | ORAL | 1 refills | Status: DC

## 2021-07-05 NOTE — Progress Notes (Signed)
? ?Subjective:  ?Patient ID: Ashley Colon, female    DOB: Feb 16, 1961  Age: 61 y.o. MRN: [id number] ? ?Chief Complaint  ?Patient presents with  ? Hypertension  ?  4 week f/u  ? ? ?HPI ? Ashley Colon is a 61 year old Caucasian female that presents for follow-up of hypertension. She was previously prescribed antihypertensive medications several years ago. Reports she was able to d/c antihypertensive meds after lost weight loss s/p gastric bypass surgery in 2008. ? ?Hypertension, follow-up: ?She was last seen for hypertension 4 weeks ago.  ?BP at that visit was 144/100. Management since that visit includes Losartan 100 mg ? ?She reports excellent compliance with treatment. ?She is not having side effects.  ?She is following a Regular diet. ?She is exercising. ?She does not smoke. ? ?Use of agents associated with hypertension: thyroid hormones.  ? ?Symptoms: ?No chest pain No chest pressure  ?No palpitations No syncope  ?No dyspnea No orthopnea  ?No paroxysmal nocturnal dyspnea No lower extremity edema  ? ?Pertinent labs: ?Lab Results  ?Component Value Date  ? CHOL 170 05/31/2021  ? HDL 29 (L) 05/31/2021  ? LDLCALC 101 (H) 05/31/2021  ? TRIG 231 (H) 05/31/2021  ? CHOLHDL 5.9 (H) 05/31/2021  ? Lab Results  ?Component Value Date  ? NA 140 05/31/2021  ? K 4.8 05/31/2021  ? CREATININE 0.74 05/31/2021  ? EGFR 94 05/31/2021  ? GFRNONAA 98 05/06/2019  ? GLUCOSE 99 05/31/2021  ?  ? ?The 10-year ASCVD risk score (Arnett DK, et al., 2019) is: 7.4%  ? ?Current Outpatient Medications on File Prior to Visit  ?Medication Sig Dispense Refill  ? icosapent Ethyl (VASCEPA) 1 g capsule Take 1 capsule (1 g total) by mouth 2 (two) times daily. 120 capsule 0  ? amphetamine-dextroamphetamine (ADDERALL) 15 MG tablet Take 1 tablet by mouth 2 (two) times daily. 60 tablet 0  ? EUTHYROX 137 MCG tablet Take 1 tablet by mouth once daily 90 tablet 3  ? losartan (COZAAR) 50 MG tablet Take 1 tablet (50 mg total) by mouth daily. 30 tablet 1  ? meloxicam  (MOBIC) 7.5 MG tablet Take 1 tablet (7.5 mg total) by mouth 2 (two) times daily. 180 tablet 1  ? Multiple Vitamin (MULTIVITAMIN) tablet Take 1 tablet by mouth 2 (two) times daily.    ? oxybutynin (DITROPAN XL) 15 MG 24 hr tablet Take 1 tablet (15 mg total) by mouth daily. 90 tablet 1  ? ?No current facility-administered medications on file prior to visit.  ? ?Past Medical History:  ?Diagnosis Date  ? Atrophy of thyroid 04/28/2019  ? 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 Other iron deficiency anemias 04/28/2019  ? Overactive bladder 04/28/2019  ? ?Past Surgical History:  ?Procedure Laterality Date  ? [place]  ? CHOLECYSTECTOMY  1990  ? GASTRIC BYPASS  2008  ?  ?Family History  ?Problem Relation Age of Onset  ? Pulmonary embolism Mother   ? Arthritis/Rheumatoid Mother   ? Coronary artery disease Father   ? Diverticulitis Father   ? Arthritis Father   ? Lung cancer Maternal Grandfather   ? Breast cancer Maternal Aunt   ? ?Social History  ? ?Socioeconomic History  ? Marital status: Married  ?  Spouse name: Not on file  ? Number of children: 1  ? Years of education: Not on file  ? Highest education level: Not on file  ?Occupational History  ? Occupation: Engineer, water  ?Tobacco Use  ?  Smoking status: Never  ? Smokeless tobacco: Never  ?Vaping Use  ? Vaping Use: Never used  ?Substance and Sexual Activity  ? Alcohol use: Yes  ?  Comment: rare  ? Drug use: Never  ? Sexual activity: Not Currently  ?Other Topics Concern  ? Not on file  ?Social History Narrative  ? Not on file  ? ?Social Determinants of Health  ? ?Financial Resource Strain: Low Risk   ? Difficulty of Paying Living Expenses: Not hard at all  ?Food Insecurity: No Food Insecurity  ? Worried About Charity fundraiser in the Last Year: Never true  ? Ran Out of Food in the Last Year: Never true  ?Transportation Needs: No Transportation Needs  ? Lack of Transportation (Medical): No  ? Lack of  Transportation (Non-Medical): No  ?Physical Activity: Sufficiently Active  ? Days of Exercise per Week: 3 days  ? Minutes of Exercise per Session: 60 min  ?Stress: No Stress Concern Present  ? Feeling of Stress : Only a little  ?Social Connections: Moderately Isolated  ? Frequency of Communication with Friends and Family: More than three times a week  ? Frequency of Social Gatherings with Friends and Family: More than three times a week  ? Attends Religious Services: Never  ? Active Member of Clubs or Organizations: No  ? Attends Archivist Meetings: Never  ? Marital Status: Married  ? ? ?Review of Systems  ?Constitutional:  Positive for fatigue.  ?HENT:  Negative for congestion.   ?Respiratory:  Negative for cough and shortness of breath.   ?Cardiovascular:  Negative for chest pain.  ?Neurological:  Negative for weakness and headaches.  ? ? ?Objective:  ?BP (!) 168/98   Pulse 83   Temp (!) 97.4 ?F (36.3 ?C)   Ht 5' 1.5" (1.562 m)   Wt 202 lb (91.6 kg)   SpO2 99%   BMI 37.55 kg/m?  ? ? ? ? ?  07/05/2021  ? 10:34 AM 06/07/2021  ?  1:58 PM 05/31/2021  ? 10:17 AM  ?BP/Weight  ?Systolic BP  024 097  ?Diastolic BP  84 353  ?Wt. (Lbs) 202    ?BMI 37.55 kg/m2    ? ? ?Physical Exam ?Vitals reviewed.  ?Constitutional:   ?   Appearance: Normal appearance.  ?HENT:  ?   Head: Normocephalic.  ?   Right Ear: Tympanic membrane normal.  ?   Left Ear: Tympanic membrane normal.  ?   Nose: Nose normal.  ?   Mouth/Throat:  ?   Mouth: Mucous membranes are moist.  ?Eyes:  ?   Pupils: Pupils are equal, round, and reactive to light.  ?Cardiovascular:  ?   Rate and Rhythm: Normal rate and regular rhythm.  ?   Pulses: Normal pulses.  ?   Heart sounds: Normal heart sounds.  ?Pulmonary:  ?   Effort: Pulmonary effort is normal.  ?   Breath sounds: Normal breath sounds.  ?Abdominal:  ?   General: Bowel sounds are normal.  ?   Palpations: Abdomen is soft.  ?Musculoskeletal:     ?   General: Normal range of motion.  ?   Cervical back:  Neck supple.  ?Skin: ?   General: Skin is warm and dry.  ?   Capillary Refill: Capillary refill takes less than 2 seconds.  ?Neurological:  ?   General: No focal deficit present.  ?   Mental Status: She is alert and oriented to person, place, and time.  ?  Psychiatric:     ?   Mood and Affect: Mood normal.     ?   Behavior: Behavior normal.  ? ? ? ?  ? ?Lab Results  ?Component Value Date  ? WBC 7.6 05/31/2021  ? HGB 14.2 05/31/2021  ? HCT 43.5 05/31/2021  ? PLT 347 05/31/2021  ? GLUCOSE 99 05/31/2021  ? CHOL 170 05/31/2021  ? TRIG 231 (H) 05/31/2021  ? HDL 29 (L) 05/31/2021  ? LDLCALC 101 (H) 05/31/2021  ? ALT 26 05/31/2021  ? AST 22 05/31/2021  ? NA 140 05/31/2021  ? K 4.8 05/31/2021  ? CL 101 05/31/2021  ? CREATININE 0.74 05/31/2021  ? BUN 13 05/31/2021  ? CO2 26 05/31/2021  ? TSH 1.170 05/31/2021  ? HGBA1C 5.6 06/03/2020  ? ? ? ? ?Assessment & Plan:  ? ?1. Essential hypertension-not at goal ?- losartan (COZAAR) 100 MG tablet; Take 1 tablet (100 mg total) by mouth daily.  Dispense: 90 tablet; Refill: 1 ?-DASH diet ?-monitor BP at home, keep log ?-notify office of any adverse side effects ? ?2. Vitamin D deficiency ?- Vitamin D, Ergocalciferol, (DRISDOL) 1.25 MG (50000 UNIT) CAPS capsule; Take 1 capsule (50,000 Units total) by mouth every 7 (seven) days.  Dispense: 5 capsule; Refill: 3 ? ?3. History of gastric bypass ?- B12 and Folate Panel ?  ? ? ? ?Resume Vitamin D 50, 000 U weekly ?Vitamin D rich diet ?Increase Losartan to 100 mg daily ?Monitor BP, keep log ?Follow-up 4-weeks, bring BP log ? ?Follow-up: 4-weeks ? ?An After Visit Summary was printed and given to the patient. ? ?I, Rip Harbour, NP, have reviewed all documentation for this visit. The documentation on 07/05/21 for the exam, diagnosis, procedures, and orders are all accurate and complete.  ? ?Rip Harbour, NP ?[facility] ?[phone] ?

## 2021-07-05 NOTE — Patient Instructions (Addendum)
Resume Vitamin D 50, 000 U weekly ?Vitamin D rich diet ?Increase Losartan to 100 mg daily ?Monitor BP, keep log ?Follow-up 4-weeks, bring BP log ?We will call you with B12 and folate results ? ?Managing Your Hypertension ?Hypertension, also called high blood pressure, is when the force of the blood pressing against the walls of the arteries is too strong. Arteries are blood vessels that carry blood from your heart throughout your body. Hypertension forces the heart to work harder to pump blood and may cause the arteries to become narrow or stiff. ?Understanding blood pressure readings ?Your personal target blood pressure may vary depending on your medical conditions, your age, and other factors. A blood pressure reading includes a higher number over a lower number. Ideally, your blood pressure should be below 120/80. You should know that: ?The first, or top, number is called the systolic pressure. It is a measure of the pressure in your arteries as your heart beats. ?The second, or bottom number, is called the diastolic pressure. It is a measure of the pressure in your arteries as the heart relaxes. ?Blood pressure is classified into four stages. Based on your blood pressure reading, your health care provider may use the following stages to determine what type of treatment you need, if any. Systolic pressure and diastolic pressure are measured in a unit called mmHg. ?Normal ?Systolic pressure: below 120. ?Diastolic pressure: below 80. ?Elevated ?Systolic pressure: 120-129. ?Diastolic pressure: below 80. ?Hypertension stage 1 ?Systolic pressure: 130-139. ?Diastolic pressure: 80-89. ?Hypertension stage 2 ?Systolic pressure: 140 or above. ?Diastolic pressure: 90 or above. ?How can this condition affect me? ?Managing your hypertension is an important responsibility. Over time, hypertension can damage the arteries and decrease blood flow to important parts of the body, including the brain, heart, and kidneys. Having  untreated or uncontrolled hypertension can lead to: ?A heart attack. ?A stroke. ?A weakened blood vessel (aneurysm). ?Heart failure. ?Kidney damage. ?Eye damage. ?Metabolic syndrome. ?Memory and concentration problems. ?Vascular dementia. ?What actions can I take to manage this condition? ?Hypertension can be managed by making lifestyle changes and possibly by taking medicines. Your health care provider will help you make a plan to bring your blood pressure within a normal range. ?Nutrition ? ?Eat a diet that is high in fiber and potassium, and low in salt (sodium), added sugar, and fat. An example eating plan is called the Dietary Approaches to Stop Hypertension (DASH) diet. To eat this way: ?Eat plenty of fresh fruits and vegetables. Try to fill one-half of your plate at each meal with fruits and vegetables. ?Eat whole grains, such as whole-wheat pasta, brown rice, or whole-grain bread. Fill about one-fourth of your plate with whole grains. ?Eat low-fat dairy products. ?Avoid fatty cuts of meat, processed or cured meats, and poultry with skin. Fill about one-fourth of your plate with lean proteins such as fish, chicken without skin, beans, eggs, and tofu. ?Avoid pre-made and processed foods. These tend to be higher in sodium, added sugar, and fat. ?Reduce your daily sodium intake. Most people with hypertension should eat less than 1,500 mg of sodium a day. ?Lifestyle ? ?Work with your health care provider to maintain a healthy body weight or to lose weight. Ask what an ideal weight is for you. ?Get at least 30 minutes of exercise that causes your heart to beat faster (aerobic exercise) most days of the week. Activities may include walking, swimming, or biking. ?Include exercise to strengthen your muscles (resistance exercise), such as weight lifting, as  part of your weekly exercise routine. Try to do these types of exercises for 30 minutes at least 3 days a week. ?Do not use any products that contain nicotine or  tobacco, such as cigarettes, e-cigarettes, and chewing tobacco. If you need help quitting, ask your health care provider. ?Control any long-term (chronic) conditions you have, such as high cholesterol or diabetes. ?Identify your sources of stress and find ways to manage stress. This may include meditation, deep breathing, or making time for fun activities. ?Alcohol use ?Do not drink alcohol if: ?Your health care provider tells you not to drink. ?You are pregnant, may be pregnant, or are planning to become pregnant. ?If you drink alcohol: ?Limit how much you use to: ?0-1 drink a day for women. ?0-2 drinks a day for men. ?Be aware of how much alcohol is in your drink. In the [place] one drink equals one 12 oz bottle of beer (355 mL), one 5 oz glass of wine (148 mL), or one 1? oz glass of hard liquor (44 mL). ?Medicines ?Your health care provider may prescribe medicine if lifestyle changes are not enough to get your blood pressure under control and if: ?Your systolic blood pressure is 130 or higher. ?Your diastolic blood pressure is 80 or higher. ?Take medicines only as told by your health care provider. Follow the directions carefully. Blood pressure medicines must be taken as told by your health care provider. The medicine does not work as well when you skip doses. Skipping doses also puts you at risk for problems. ?Monitoring ?Before you monitor your blood pressure: ?Do not smoke, drink caffeinated beverages, or exercise within 30 minutes before taking a measurement. ?Use the bathroom and empty your bladder (urinate). ?Sit quietly for at least 5 minutes before taking measurements. ?Monitor your blood pressure at home as told by your health care provider. To do this: ?Sit with your back straight and supported. ?Place your feet flat on the floor. Do not cross your legs. ?Support your arm on a flat surface, such as a table. Make sure your upper arm is at heart level. ?Each time you measure, take two or three readings one  minute apart and record the results. ?You may also need to have your blood pressure checked regularly by your health care provider. ?General information ?Talk with your health care provider about your diet, exercise habits, and other lifestyle factors that may be contributing to hypertension. ?Review all the medicines you take with your health care provider because there may be side effects or interactions. ?Keep all visits as told by your health care provider. Your health care provider can help you create and adjust your plan for managing your high blood pressure. ?Where to find more information ?National Heart, Lung, and Blood Institute: PopSteam.is ?American Heart Association: www.heart.org ?Contact a health care provider if: ?You think you are having a reaction to medicines you have taken. ?You have repeated (recurrent) headaches. ?You feel dizzy. ?You have swelling in your ankles. ?You have trouble with your vision. ?Get help right away if: ?You develop a severe headache or confusion. ?You have unusual weakness or numbness, or you feel faint. ?You have severe pain in your chest or abdomen. ?You vomit repeatedly. ?You have trouble breathing. ?These symptoms may represent a serious problem that is an emergency. Do not wait to see if the symptoms will go away. Get medical help right away. Call your local emergency services (911 in the [place] Do not drive yourself to the hospital. ?Summary ?Hypertension is  when the force of blood pumping through your arteries is too strong. If this condition is not controlled, it may put you at risk for serious complications. ?Your personal target blood pressure may vary depending on your medical conditions, your age, and other factors. For most people, a normal blood pressure is less than 120/80. ?Hypertension is managed by lifestyle changes, medicines, or both. ?Lifestyle changes to help manage hypertension include losing weight, eating a healthy, low-sodium diet,  exercising more, stopping smoking, and limiting alcohol. ?This information is not intended to replace advice given to you by your health care provider. Make sure you discuss any questions you have with your health

## 2021-07-06 LAB — B12 AND FOLATE PANEL
Folate: >20 ng/mL (ref >3.0)
Vitamin B-12: 1341 pg/mL — ABNORMAL HIGH (ref 232–1245)

## 2021-07-12 ENCOUNTER — Other Ambulatory Visit: Payer: Self-pay | Admitting: Family Medicine

## 2021-07-12 DIAGNOSIS — F900 Attention-deficit hyperactivity disorder, predominantly inattentive type: Secondary | ICD-10-CM

## 2021-07-13 MED ORDER — AMPHETAMINE-DEXTROAMPHETAMINE 15 MG PO TABS: 1.0000 | ORAL_TABLET | Freq: Two times a day (BID) | ORAL | 0 refills | Status: DC

## 2021-08-02 ENCOUNTER — Ambulatory Visit: Payer: BC Managed Care – PPO

## 2021-08-02 ENCOUNTER — Other Ambulatory Visit: Payer: Self-pay | Admitting: Family Medicine

## 2021-08-02 DIAGNOSIS — E034 Atrophy of thyroid (acquired): Secondary | ICD-10-CM

## 2021-08-02 DIAGNOSIS — N3941 Urge incontinence: Secondary | ICD-10-CM

## 2021-08-02 NOTE — Progress Notes (Signed)
? ?Established Patient Office Visit ? ?Subjective   ?Patient ID: Ashley Colon, female    DOB: [birth year]  Age: 61 y.o. MRN: [id number] ? ?CC ?Hypertension ? ?HPI ?[name] is a 61 year old Caucasian female that presents for follow-up of hypertension. States she has a chronic skin lesion to left lateral thigh area. States lesion has been present for over a year. She has a family history of skin cancer with her father.  ? ? ?Hypertension, follow-up:  ?She was last seen for hypertension 4 weeks ago.  ?BP at that visit was 168/98. Management since that visit includes Cozaar 100 mg . ? ?She reports excellent compliance with treatment. ?She is not having side effects.  ?She is following a Regular diet. ?She is not exercising. ?She does not smoke. ? ?Use of agents associated with hypertension: amphetamines, NSAIDS, and thyroid hormones.  ? ?Outside blood pressures are 145/77, 149/80, 137/78, 142/80, 142/80, 142/82, 141/81,127/73, 130/81 ? ?Pertinent labs: ?Lab Results  ?Component Value Date  ? CHOL 170 05/31/2021  ? HDL 29 (L) 05/31/2021  ? LDLCALC 101 (H) 05/31/2021  ? TRIG 231 (H) 05/31/2021  ? CHOLHDL 5.9 (H) 05/31/2021  ? Lab Results  ?Component Value Date  ? NA 140 05/31/2021  ? K 4.8 05/31/2021  ? CREATININE 0.74 05/31/2021  ? EGFR 94 05/31/2021  ? GFRNONAA 98 05/06/2019  ? GLUCOSE 99 05/31/2021  ?  ? ? ? ? ?Review of Systems  ?Constitutional:  Negative for chills, fever and malaise/fatigue.  ?HENT:  Negative for congestion, ear pain, sinus pain and sore throat.   ?Eyes:  Negative for pain.  ?Respiratory:  Negative for cough.   ?Cardiovascular:  Negative for chest pain and orthopnea.  ?Gastrointestinal:  Negative for abdominal pain, constipation, diarrhea, heartburn, nausea and vomiting.  ?Genitourinary:  Negative for dysuria, frequency and urgency.  ?Musculoskeletal:  Negative for back pain, joint pain and myalgias.  ?Skin:  Negative for rash.  ?Neurological:  Negative for dizziness and headaches.   ?Endo/Heme/Allergies:  Negative for environmental allergies.  ?Psychiatric/Behavioral:  Negative for depression. The patient is not nervous/anxious.   ? ?  ?Objective:  ? BP (!) 144/92   Pulse 88   Temp (!) 97.3 ?F (36.3 ?C)   Ht 5' 1.5" (1.562 m)   Wt 202 lb (91.6 kg)   SpO2 99%   BMI 37.55 kg/m?   ? ? ?Physical Exam ?Vitals reviewed.  ?Skin: ?   General: Skin is warm and dry.  ?   Capillary Refill: Capillary refill takes less than 2 seconds.  ?Neurological:  ?   General: No focal deficit present.  ?   Mental Status: She is alert and oriented to person, place, and time.  ?Psychiatric:     ?   Mood and Affect: Mood normal.     ?   Behavior: Behavior normal.  ? ?  ?Assessment & Plan:  ? ?1. Essential hypertension-not at goal ?- amLODipine (NORVASC) 5 MG tablet; Take 1 tablet (5 mg total) by mouth daily.  Dispense: 30 tablet; Refill: 0 ?-continue Losartan 100 mg daily ?-DASH diet ?-increase physical activity ? ?2. Need for vaccination ?- Varicella-zoster vaccine IM (Shingrix) ? ?3. Skin lesion of left leg ?- triamcinolone (KENALOG) 0.025 % ointment; Apply 1 application. topically 2 (two) times daily.  Dispense: 30 g; Refill: 0 ? ?4. Family history of skin cancer ?  ? ? ?Begin Amlodipine 5 mg daily ?Continue Losartan 100 mg  ?Continue BP log ?DASH diet ?Apply Triamcinolone cream to left thigh skin  twice daily ?Follow-up in 2 weeks, bring BP log ? ?I, Rip Harbour, NP, have reviewed all documentation for this visit. The documentation on 08/03/21 for the exam, diagnosis, procedures, and orders are all accurate and complete.  ? ?Signed ?Rip Harbour, NP ? ?

## 2021-08-03 ENCOUNTER — Encounter: Payer: Self-pay | Admitting: Nurse Practitioner

## 2021-08-03 ENCOUNTER — Ambulatory Visit: Payer: BC Managed Care – PPO | Admitting: Nurse Practitioner

## 2021-08-03 VITALS — BP 144/92 | HR 88 | Temp 97.3°F | Ht 61.5 in | Wt 202.0 lb

## 2021-08-03 DIAGNOSIS — Z808 Family history of malignant neoplasm of other organs or systems: Secondary | ICD-10-CM | POA: Diagnosis not present

## 2021-08-03 DIAGNOSIS — Z23 Encounter for immunization: Secondary | ICD-10-CM

## 2021-08-03 DIAGNOSIS — L989 Disorder of the skin and subcutaneous tissue, unspecified: Secondary | ICD-10-CM | POA: Diagnosis not present

## 2021-08-03 DIAGNOSIS — I10 Essential (primary) hypertension: Secondary | ICD-10-CM

## 2021-08-03 MED ORDER — AMLODIPINE BESYLATE 5 MG PO TABS: 5.0000 mg | ORAL_TABLET | Freq: Every day | ORAL | 0 refills | Status: DC

## 2021-08-03 MED ORDER — TRIAMCINOLONE ACETONIDE 0.025 % EX OINT: 1.0000 | TOPICAL_OINTMENT | Freq: Two times a day (BID) | CUTANEOUS | 0 refills | Status: DC

## 2021-08-03 NOTE — Patient Instructions (Addendum)
Begin Amlodipine 5 mg daily ?Continue Losartan 100 mg  ?Continue BP log ?DASH diet ?Apply Triamcinolone cream to left thigh skin twice daily ?Follow-up in 2 weeks, bring BP log ? ? ? ?Hypertension, Adult ?Hypertension is another name for high blood pressure. High blood pressure forces your heart to work harder to pump blood. This can cause problems over time. ?There are two numbers in a blood pressure reading. There is a top number (systolic) over a bottom number (diastolic). It is best to have a blood pressure that is below 120/80. ?What are the causes? ?The cause of this condition is not known. Some other conditions can lead to high blood pressure. ?What increases the risk? ?Some lifestyle factors can make you more likely to develop high blood pressure: ?Smoking. ?Not getting enough exercise or physical activity. ?Being overweight. ?Having too much fat, sugar, calories, or salt (sodium) in your diet. ?Drinking too much alcohol. ?Other risk factors include: ?Having any of these conditions: ?Heart disease. ?Diabetes. ?High cholesterol. ?Kidney disease. ?Obstructive sleep apnea. ?Having a family history of high blood pressure and high cholesterol. ?Age. The risk increases with age. ?Stress. ?What are the signs or symptoms? ?High blood pressure may not cause symptoms. Very high blood pressure (hypertensive crisis) may cause: ?Headache. ?Fast or uneven heartbeats (palpitations). ?Shortness of breath. ?Nosebleed. ?Vomiting or feeling like you may vomit (nauseous). ?Changes in how you see. ?Very bad chest pain. ?Feeling dizzy. ?Seizures. ?How is this treated? ?This condition is treated by making healthy lifestyle changes, such as: ?Eating healthy foods. ?Exercising more. ?Drinking less alcohol. ?Your doctor may prescribe medicine if lifestyle changes do not help enough and if: ?Your top number is above 130. ?Your bottom number is above 80. ?Your personal target blood pressure may vary. ?Follow these instructions at  home: ?Eating and drinking ? ?If told, follow the DASH eating plan. To follow this plan: ?Fill one half of your plate at each meal with fruits and vegetables. ?Fill one fourth of your plate at each meal with whole grains. Whole grains include whole-wheat pasta, brown rice, and whole-grain bread. ?Eat or drink low-fat dairy products, such as skim milk or low-fat yogurt. ?Fill one fourth of your plate at each meal with low-fat (lean) proteins. Low-fat proteins include fish, chicken without skin, eggs, beans, and tofu. ?Avoid fatty meat, cured and processed meat, or chicken with skin. ?Avoid pre-made or processed food. ?Limit the amount of salt in your diet to less than 1,500 mg each day. ?Do not drink alcohol if: ?Your doctor tells you not to drink. ?You are pregnant, may be pregnant, or are planning to become pregnant. ?If you drink alcohol: ?Limit how much you have to: ?0-1 drink a day for women. ?0-2 drinks a day for men. ?Know how much alcohol is in your drink. In the [place] one drink equals one 12 oz bottle of beer (355 mL), one 5 oz glass of wine (148 mL), or one 1? oz glass of hard liquor (44 mL). ?Lifestyle ? ?Work with your doctor to stay at a healthy weight or to lose weight. Ask your doctor what the best weight is for you. ?Get at least 30 minutes of exercise that causes your heart to beat faster (aerobic exercise) most days of the week. This may include walking, swimming, or biking. ?Get at least 30 minutes of exercise that strengthens your muscles (resistance exercise) at least 3 days a week. This may include lifting weights or doing Pilates. ?Do not smoke or use any products  that contain nicotine or tobacco. If you need help quitting, ask your doctor. ?Check your blood pressure at home as told by your doctor. ?Keep all follow-up visits. ?Medicines ?Take over-the-counter and prescription medicines only as told by your doctor. Follow directions carefully. ?Do not skip doses of blood pressure medicine. The  medicine does not work as well if you skip doses. Skipping doses also puts you at risk for problems. ?Ask your doctor about side effects or reactions to medicines that you should watch for. ?Contact a doctor if: ?You think you are having a reaction to the medicine you are taking. ?You have headaches that keep coming back. ?You feel dizzy. ?You have swelling in your ankles. ?You have trouble with your vision. ?Get help right away if: ?You get a very bad headache. ?You start to feel mixed up (confused). ?You feel weak or numb. ?You feel faint. ?You have very bad pain in your: ?Chest. ?Belly (abdomen). ?You vomit more than once. ?You have trouble breathing. ?These symptoms may be an emergency. Get help right away. Call 911. ?Do not wait to see if the symptoms will go away. ?Do not drive yourself to the hospital. ?Summary ?Hypertension is another name for high blood pressure. ?High blood pressure forces your heart to work harder to pump blood. ?For most people, a normal blood pressure is less than 120/80. ?Making healthy choices can help lower blood pressure. If your blood pressure does not get lower with healthy choices, you may need to take medicine. ?This information is not intended to replace advice given to you by your health care provider. Make sure you discuss any questions you have with your health care provider. ?Document Revised: 12/30/2020 Document Reviewed: 12/30/2020 ?Elsevier Patient Education ? 2023 [employer]. ?DASH Eating Plan ?DASH stands for Dietary Approaches to Stop Hypertension. The DASH eating plan is a healthy eating plan that has been shown to: ?Reduce high blood pressure (hypertension). ?Reduce your risk for type 2 diabetes, heart disease, and stroke. ?Help with weight loss. ?What are tips for following this plan? ?Reading food labels ?Check food labels for the amount of salt (sodium) per serving. Choose foods with less than 5 percent of the Daily Value of sodium. Generally, foods with less  than 300 milligrams (mg) of sodium per serving fit into this eating plan. ?To find whole grains, look for the word "whole" as the first word in the ingredient list. ?Shopping ?Buy products labeled as "low-sodium" or "no salt added." ?Buy fresh foods. Avoid canned foods and pre-made or frozen meals. ?Cooking ?Avoid adding salt when cooking. Use salt-free seasonings or herbs instead of table salt or sea salt. Check with your health care provider or pharmacist before using salt substitutes. ?Do not fry foods. Cook foods using healthy methods such as baking, boiling, grilling, roasting, and broiling instead. ?Cook with heart-healthy oils, such as olive, canola, avocado, soybean, or sunflower oil. ?Meal planning ? ?Eat a balanced diet that includes: ?4 or more servings of fruits and 4 or more servings of vegetables each day. Try to fill one-half of your plate with fruits and vegetables. ?6-8 servings of whole grains each day. ?Less than 6 oz (170 g) of lean meat, poultry, or fish each day. A 3-oz (85-g) serving of meat is about the same size as a deck of cards. One egg equals 1 oz (28 g). ?2-3 servings of low-fat dairy each day. One serving is 1 cup (237 mL). ?1 serving of nuts, seeds, or beans 5 times each week. ?  2-3 servings of heart-healthy fats. Healthy fats called omega-3 fatty acids are found in foods such as walnuts, flaxseeds, fortified milks, and eggs. These fats are also found in cold-water fish, such as sardines, salmon, and mackerel. ?Limit how much you eat of: ?Canned or prepackaged foods. ?Food that is high in trans fat, such as some fried foods. ?Food that is high in saturated fat, such as fatty meat. ?Desserts and other sweets, sugary drinks, and other foods with added sugar. ?Full-fat dairy products. ?Do not salt foods before eating. ?Do not eat more than 4 egg yolks a week. ?Try to eat at least 2 vegetarian meals a week. ?Eat more home-cooked food and less restaurant, buffet, and fast  food. ?Lifestyle ?When eating at a restaurant, ask that your food be prepared with less salt or no salt, if possible. ?If you drink alcohol: ?Limit how much you use to: ?0-1 drink a day for women who are not pregnant. ?0-2 dr

## 2021-08-17 ENCOUNTER — Ambulatory Visit: Payer: BC Managed Care – PPO | Admitting: Nurse Practitioner

## 2021-08-17 ENCOUNTER — Other Ambulatory Visit: Payer: Self-pay | Admitting: Nurse Practitioner

## 2021-08-17 ENCOUNTER — Encounter: Payer: Self-pay | Admitting: Nurse Practitioner

## 2021-08-17 VITALS — BP 132/80 | HR 72 | Temp 97.2°F | Ht 61.5 in | Wt 201.0 lb

## 2021-08-17 DIAGNOSIS — I10 Essential (primary) hypertension: Secondary | ICD-10-CM

## 2021-08-17 DIAGNOSIS — F900 Attention-deficit hyperactivity disorder, predominantly inattentive type: Secondary | ICD-10-CM

## 2021-08-17 DIAGNOSIS — Z23 Encounter for immunization: Secondary | ICD-10-CM | POA: Diagnosis not present

## 2021-08-17 MED ORDER — AMPHETAMINE-DEXTROAMPHET ER 30 MG PO CP24: 30.0000 mg | ORAL_CAPSULE | Freq: Every day | ORAL | 0 refills | Status: DC

## 2021-08-17 NOTE — Patient Instructions (Addendum)
Continue medications Continue heart healthy diet Notify office of any side effects of Adderall 30 mg XR Tdap given in office today Follow-up in 51-months   Tdap (Tetanus, Diphtheria, Pertussis) Vaccine: What You Need to Know 1. Why get vaccinated? Tdap vaccine can prevent tetanus, diphtheria, and pertussis. Diphtheria and pertussis spread from person to person. Tetanus enters the body through cuts or wounds. TETANUS (T) causes painful stiffening of the muscles. Tetanus can lead to serious health problems, including being unable to open the mouth, having trouble swallowing and breathing, or death. DIPHTHERIA (D) can lead to difficulty breathing, heart failure, paralysis, or death. PERTUSSIS (aP), also known as "whooping cough," can cause uncontrollable, violent coughing that makes it hard to breathe, eat, or drink. Pertussis can be extremely serious especially in babies and young children, causing pneumonia, convulsions, brain damage, or death. In teens and adults, it can cause weight loss, loss of bladder control, passing out, and rib fractures from severe coughing. 2. Tdap vaccine Tdap is only for children 7 years and older, adolescents, and adults.  Adolescents should receive a single dose of Tdap, preferably at age 98 or 12 years. Pregnant people should get a dose of Tdap during every pregnancy, preferably during the early part of the third trimester, to help protect the newborn from pertussis. Infants are most at risk for severe, life-threatening complications from pertussis. Adults who have never received Tdap should get a dose of Tdap. Also, adults should receive a booster dose of either Tdap or Td (a different vaccine that protects against tetanus and diphtheria but not pertussis) every 10 years, or after 5 years in the case of a severe or dirty wound or burn. Tdap may be given at the same time as other vaccines. 3. Talk with your health care provider Tell your vaccine provider if the  person getting the vaccine: Has had an allergic reaction after a previous dose of any vaccine that protects against tetanus, diphtheria, or pertussis, or has any severe, life-threatening allergies Has had a coma, decreased level of consciousness, or prolonged seizures within 7 days after a previous dose of any pertussis vaccine (DTP, DTaP, or Tdap) Has seizures or another nervous system problem Has ever had Guillain-Barr Syndrome (also called "GBS") Has had severe pain or swelling after a previous dose of any vaccine that protects against tetanus or diphtheria In some cases, your health care provider may decide to postpone Tdap vaccination until a future visit. People with minor illnesses, such as a cold, may be vaccinated. People who are moderately or severely ill should usually wait until they recover before getting Tdap vaccine.  Your health care provider can give you more information. 4. Risks of a vaccine reaction Pain, redness, or swelling where the shot was given, mild fever, headache, feeling tired, and nausea, vomiting, diarrhea, or stomachache sometimes happen after Tdap vaccination. People sometimes faint after medical procedures, including vaccination. Tell your provider if you feel dizzy or have vision changes or ringing in the ears.  As with any medicine, there is a very remote chance of a vaccine causing a severe allergic reaction, other serious injury, or death. 5. What if there is a serious problem? An allergic reaction could occur after the vaccinated person leaves the clinic. If you see signs of a severe allergic reaction (hives, swelling of the face and throat, difficulty breathing, a fast heartbeat, dizziness, or weakness), call 9-1-1 and get the person to the nearest hospital. For other signs that concern you, call your health  care provider.  Adverse reactions should be reported to the Vaccine Adverse Event Reporting System (VAERS). Your health care provider will usually file  this report, or you can do it yourself. Visit the VAERS website at www.vaers.LAgents.nohhs.gov or call (628)888-54611-951-410-4518. VAERS is only for reporting reactions, and VAERS staff members do not give medical advice. 6. The National Vaccine Injury Compensation Program The [employer] Vaccine Injury Compensation Program (VICP) is a federal program that was created to compensate people who may have been injured by certain vaccines. Claims regarding alleged injury or death due to vaccination have a time limit for filing, which may be as short as two years. Visit the VICP website at SpiritualWord.atwww.hrsa.gov/vaccinecompensation or call 612-336-91011-253-824-6405 to learn about the program and about filing a claim. 7. How can I learn more? Ask your health care provider. Call your local or state health department. Visit the website of the Food and Drug Administration (FDA) for vaccine package inserts and additional information at FinderList.nowww.fda.gov/vaccines-blood-biologics/vaccines. Contact the Centers for Disease Control and Prevention (CDC): Call 831-423-22291-(519)577-7112 (1-800-CDC-INFO) or Visit CDC's website at PicCapture.uywww.cdc.gov/vaccines. Source: CDC Vaccine Information Statement Tdap (Tetanus, Diphtheria, Pertussis) Vaccine (10/31/2019) This same material is available at FootballExhibition.com.brwww.cdc.gov for no charge. This information is not intended to replace advice given to you by your health care provider. Make sure you discuss any questions you have with your health care provider. Document Revised: 02/09/2021 Document Reviewed: 12/13/2020 Elsevier Patient Education  2023 Elsevier Inc.  High Triglycerides Eating Plan Triglycerides are a type of fat in the blood. High levels of triglycerides can increase your risk of heart disease and stroke. If your triglyceride levels are high, choosing the right foods can help lower your triglycerides and keep your heart healthy. Work with your health care provider or a dietitian to develop an eating plan that is right for you. What are tips for  following this plan? General guidelines  Lose weight, if you are overweight. For most people, losing 5-10 lb (2-5 kg) helps lower triglyceride levels. A weight-loss plan may include: 30 minutes of exercise at least 5 days a week. Reducing the amount of calories, sugar, and fat you eat. Eat a wide variety of fresh fruits, vegetables, and whole grains. These foods are high in fiber. Eat foods that contain healthy fats, such as fatty fish, nuts, seeds, and olive oil. Avoid foods that are high in added sugar, added salt (sodium), and saturated fat. Avoid low-fiber, refined carbohydrates such as white bread, crackers, noodles, and white rice. Avoid foods with trans fats or partially hydrogenated oils, such as fried foods or stick margarine. If you drink alcohol: Limit how much you have to: 0-1 drink a day for women who are not pregnant. 0-2 drinks a day for men. Your health care provider may recommend that you drink less than these amounts depending on your overall health. Know how much alcohol is in a drink. In the [place] one drink equals one 12 oz bottle of beer (355 mL), one 5 oz glass of wine (148 mL), or one 1 oz glass of hard liquor (44 mL). Reading food labels Check food labels for: The amount of saturated fat. Choose foods with no or very little saturated fat (less than 2 g). The amount of trans fat. Choose foods with no transfat. The amount of cholesterol. Choose foods that are low in cholesterol. The amount of sodium. Choose foods with less than 140 milligrams (mg) per serving. Shopping Buy dairy products labeled as nonfat (skim) or low-fat (1%). Avoid buying  processed or prepackaged foods. These are often high in added sugar, sodium, and fat. Cooking Choose healthy fats when cooking, such as olive oil, avocado oil, or canola oil. Cook foods using lower fat methods, such as baking, broiling, boiling, or grilling. Make your own sauces, dressings, and marinades when possible, instead  of buying them. Store-bought sauces, dressings, and marinades are often high in sodium and sugar. Meal planning Eat more home-cooked food and less restaurant, buffet, and fast food. Eat fatty fish at least 2 times each week. Examples of fatty fish include salmon, trout, sardines, mackerel, tuna, and herring. If you eat whole eggs, do not eat more than 4 egg yolks per week. What foods should I eat? Fruits All fresh, canned (in natural juice), or frozen fruits. Vegetables Fresh or frozen vegetables. Low-sodium canned vegetables. Grains Whole wheat or whole grain breads, crackers, cereals, and pasta. Unsweetened oatmeal. Bulgur. Barley. Quinoa. Brown rice. Whole wheat flour tortillas. Meats and other proteins Skinless chicken or [place]. Ground chicken or [place]. Lean cuts of pork, trimmed of fat. Fish and seafood, especially salmon, trout, and herring. Egg whites. Dried beans, peas, or lentils. Unsalted nuts or seeds. Unsalted canned beans. Natural peanut or almond butter or other nut butters. Dairy Low-fat dairy products. Skim or low-fat (1%) milk. Reduced fat (2%) and low-sodium cheese. Low-fat ricotta cheese. Low-fat cottage cheese. Plain, low-fat yogurt. Fats and oils Tub margarine without trans fats. Light or reduced-fat mayonnaise. Light or reduced-fat salad dressings. Avocado. Safflower, olive, sunflower, soybean, and canola oils. The items listed above may not be a complete list of recommended foods and beverages. Talk with your dietitian about what dietary choices are best for you. What foods should I avoid? Fruits Sweetened dried fruit. Canned fruit in syrup. Fruit juice. Vegetables Creamed or fried vegetables. Vegetables in a cheese sauce. Grains White bread. White (regular) pasta. White rice. Cornbread. Bagels. Pastries. Crackers that contain trans fat. Meats and other proteins Fatty cuts of meat. Ribs. Chicken wings. Tomasa Blase. Sausage. Bologna. Salami. Chitterlings. Fatback. Hot  dogs. Bratwurst. Packaged lunch meats. Dairy Whole or reduced-fat (2%) milk. Half-and-half. Cream cheese. Full-fat or sweetened yogurt. Full-fat cheese. Nondairy creamers. Whipped toppings. Processed cheese or cheese spreads. Cheese curds. Fats and oils Butter. Stick margarine. Lard. Shortening. Ghee. Bacon fat. Tropical oils, such as coconut, palm kernel, or palm oils. Beverages Alcohol. Sweetened drinks, such as soda, lemonade, fruit drinks, or punches. Sweets and desserts Corn syrup. Sugars. Honey. Molasses. Candy. Jam and jelly. Syrup. Sweetened cereals. Cookies. Pies. Cakes. Donuts. Muffins. Ice cream. Condiments Store-bought sauces, dressings, and marinades that are high in sugar, such as ketchup and barbecue sauce. The items listed above may not be a complete list of foods and beverages you should avoid. Talk with your dietitian about what dietary choices are best for you. Summary High levels of triglycerides can increase the risk of heart disease and stroke. Choosing the right foods can help lower your triglycerides. Eat plenty of fresh fruits, vegetables, and whole grains. Choose low-fat dairy and lean meats. Eat fatty fish at least twice a week. Avoid processed and prepackaged foods with added sugar, sodium, saturated fat, and trans fat. If you need suggestions or have questions about what types of food are good for you, talk with your health care provider or a dietitian. This information is not intended to replace advice given to you by your health care provider. Make sure you discuss any questions you have with your health care provider. Document Revised: 07/23/2020 Document Reviewed: 07/23/2020  Elsevier Patient Education  [place].

## 2021-08-17 NOTE — Progress Notes (Signed)
 Subjective:  Patient ID: Ashley Colon, female    DOB: 12/04/1960  Age: 60 y.o. MRN: [id number]  Chief Complaint  Patient presents with   Hypertension   HPI: Pt presents for f/u of HTN. She is past due for tdap. Denies any acute medical problems today.    Hypertension, follow-up:   She was last seen for hypertension 2 weeks ago.  BP at that visit was 142/92. Management since that visit includes Norvasc 5 mg and Losartan 100 mg QD. She reports excellent compliance with treatment. She is not having side effects.  She is following a Regular diet. She is not exercising. She does not smoke.  Use of agents associated with hypertension: thyroid hormones.   Outside blood pressures are 145/77, 149/80, 137/78, 142/80, 142/80, 142/82, 141/81, 127/73, 130/81, 138/74, 136/73, 119/68, 123/70, 128/78, 122/70 Pertinent labs: Lab Results  Component Value Date   CHOL 170 05/31/2021   HDL 29 (L) 05/31/2021   LDLCALC 101 (H) 05/31/2021   TRIG 231 (H) 05/31/2021   CHOLHDL 5.9 (H) 05/31/2021   Lab Results  Component Value Date   NA 140 05/31/2021   K 4.8 05/31/2021   CREATININE 0.74 05/31/2021   EGFR 94 05/31/2021   GFRNONAA 98 05/06/2019   GLUCOSE 99 05/31/2021     The 10-year ASCVD risk score (Arnett DK, et al., 2019) is: 6.5%    Current Outpatient Medications on File Prior to Visit  Medication Sig Dispense Refill   amLODipine (NORVASC) 5 MG tablet Take 1 tablet (5 mg total) by mouth daily. 30 tablet 0   amphetamine-dextroamphetamine (ADDERALL) 15 MG tablet Take 1 tablet by mouth 2 (two) times daily. 60 tablet 0   EUTHYROX 137 MCG tablet Take 1 tablet by mouth once daily 90 tablet 1   icosapent Ethyl (VASCEPA) 1 g capsule Take 1 capsule by mouth twice daily 360 capsule 1   losartan (COZAAR) 100 MG tablet Take 1 tablet (100 mg total) by mouth daily. 90 tablet 1   meloxicam (MOBIC) 7.5 MG tablet Take 1 tablet (7.5 mg total) by mouth 2 (two) times daily. 180 tablet 1   Multiple  Vitamin (MULTIVITAMIN) tablet Take 1 tablet by mouth 2 (two) times daily.     oxybutynin (DITROPAN XL) 15 MG 24 hr tablet Take 1 tablet by mouth once daily 90 tablet 1   triamcinolone (KENALOG) 0.025 % ointment Apply 1 application. topically 2 (two) times daily. 30 g 0   Vitamin D, Ergocalciferol, (DRISDOL) 1.25 MG (50000 UNIT) CAPS capsule Take 1 capsule (50,000 Units total) by mouth every 7 (seven) days. 5 capsule 3   No current facility-administered medications on file prior to visit.   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   Overactive bladder 04/28/2019   Past Surgical History:  Procedure Laterality Date   CESAREAN SECTION  1999   CHOLECYSTECTOMY  1990   GASTRIC BYPASS  2008    Family History  Problem Relation Age of Onset   Pulmonary embolism Mother    Arthritis/Rheumatoid Mother    Coronary artery disease Father    Diverticulitis Father    Arthritis Father    Lung cancer Maternal Grandfather    Breast cancer Maternal Aunt    Social History   Socioeconomic History   Marital status: Married    Spouse name: Not on file   Number of children: 1     Years of education: Not on file   Highest education level: Not on file  Occupational History   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, water quality  Tobacco Use   Smoking status: Never   Smokeless tobacco: Never  Vaping Use   Vaping Use: Never used  Substance and Sexual Activity   Alcohol use: Yes    Comment: rare   Drug use: Never   Sexual activity: Not Currently  Other Topics Concern   Not on file  Social History Narrative   Not on file   Social Determinants of Health   Financial Resource Strain: Low Risk    Difficulty of Paying Living Expenses: Not hard at all  Food Insecurity: No Food Insecurity   Worried About Charity fundraiser in the Last Year: Never true   [place] in the Last Year: Never true  Transportation Needs: No Transportation Needs   Lack of Transportation (Medical): No   Lack of Transportation (Non-Medical): No  Physical Activity: Sufficiently Active   Days of Exercise per Week: 3 days   Minutes of Exercise per Session: 60 min  Stress: No Stress Concern Present   Feeling of Stress : Only a little  Social Connections: Moderately Isolated   Frequency of Communication with Friends and Family: More than three times a week   Frequency of Social Gatherings with Friends and Family: More than three times a week   Attends Religious Services: Never   Marine scientist or Organizations: No   Attends Archivist Meetings: Never   Marital Status: Married    Review of Systems  Constitutional:  Negative for chills, fatigue and fever.  HENT:  Negative for congestion, ear pain, rhinorrhea and sore throat.   Respiratory:  Negative for cough and shortness of breath.   Cardiovascular:  Negative for chest pain.  Gastrointestinal:  Negative for abdominal pain, constipation, diarrhea, nausea and vomiting.  Genitourinary:  Negative for dysuria and urgency.  Musculoskeletal:  Negative for back pain and myalgias.  Neurological:  Negative for dizziness, weakness, light-headedness and headaches.  Psychiatric/Behavioral:  Negative for dysphoric mood. The patient is not nervous/anxious.     Objective:  BP 132/80   Pulse 72   Temp (!) 97.2 F (36.2 C)   Ht 5' 1.5" (1.562 m)   Wt 201 lb (91.2 kg)   SpO2 99%   BMI 37.36 kg/m       08/17/2021    8:26 AM 08/03/2021    8:06 AM 07/05/2021   10:34 AM  BP/Weight  Systolic BP  329 924  Diastolic BP  92 98  Wt. (Lbs) 201 202 202  BMI 37.36 kg/m2 37.55 kg/m2 37.55 kg/m2    Physical Exam Vitals reviewed.  Constitutional:      Appearance: Normal appearance.  HENT:     Head: Normocephalic.     Right Ear: Tympanic membrane normal.     Left Ear: Tympanic membrane normal.  Cardiovascular:      Rate and Rhythm: Normal rate and regular rhythm.     Pulses: Normal pulses.     Heart sounds: Normal heart sounds.  Pulmonary:     Effort: Pulmonary effort is normal.     Breath sounds: Normal breath sounds.  Abdominal:     General: Bowel sounds are normal.     Palpations: Abdomen is soft.  Skin:    General: Skin is warm and dry.     Capillary Refill: Capillary refill takes less than 2 seconds.  Neurological:  General: No focal deficit present.     Mental Status: She is alert and oriented to person, place, and time.  Psychiatric:        Mood and Affect: Mood normal.        Behavior: Behavior normal.       Lab Results  Component Value Date   WBC 7.6 05/31/2021   HGB 14.2 05/31/2021   HCT 43.5 05/31/2021   PLT 347 05/31/2021   GLUCOSE 99 05/31/2021   CHOL 170 05/31/2021   TRIG 231 (H) 05/31/2021   HDL 29 (L) 05/31/2021   LDLCALC 101 (H) 05/31/2021   ALT 26 05/31/2021   AST 22 05/31/2021   NA 140 05/31/2021   K 4.8 05/31/2021   CL 101 05/31/2021   CREATININE 0.74 05/31/2021   BUN 13 05/31/2021   CO2 26 05/31/2021   TSH 1.170 05/31/2021   HGBA1C 5.6 06/03/2020      Assessment & Plan:   1. Essential hypertension-well controlled - Comprehensive metabolic panel -continue Norvas 5 mg and Losartan 100 mg  2. Need for Tdap vaccination - Tdap vaccine greater than or equal to 7yo IM   Continue medications Continue heart healthy diet Notify office of any side effects of Adderall 30 mg XR Tdap given in office today Follow-up in 3-months    Follow-up: 3-months, fasting  An After Visit Summary was printed and given to the patient.  I, Shannon J Heaton, NP, have reviewed all documentation for this visit. The documentation on 08/17/21 for the exam, diagnosis, procedures, and orders are all accurate and complete.    Signed, Shannon J Heaton, NP [facility] [phone] 

## 2021-08-18 LAB — COMPREHENSIVE METABOLIC PANEL
ALT: 20 IU/L (ref 0–32)
AST: 18 IU/L (ref 0–40)
Albumin/Globulin Ratio: 1.7 (ref 1.2–2.2)
Albumin: 4.3 g/dL (ref 3.8–4.9)
Alkaline Phosphatase: 85 IU/L (ref 44–121)
BUN/Creatinine Ratio: 18 (ref 9–23)
BUN: 12 mg/dL (ref 6–24)
Bilirubin Total: 0.4 mg/dL (ref 0.0–1.2)
CO2: 24 mmol/L (ref 20–29)
Calcium: 9.4 mg/dL (ref 8.7–10.2)
Chloride: 104 mmol/L (ref 96–106)
Creatinine, Ser: 0.66 mg/dL (ref 0.57–1.00)
Globulin, Total: 2.6 g/dL (ref 1.5–4.5)
Glucose: 108 mg/dL — ABNORMAL HIGH (ref 70–99)
Potassium: 4.9 mmol/L (ref 3.5–5.2)
Sodium: 143 mmol/L (ref 134–144)
Total Protein: 6.9 g/dL (ref 6.0–8.5)
eGFR: 102 mL/min/{1.73_m2} (ref >59)

## 2021-08-27 ENCOUNTER — Other Ambulatory Visit: Payer: Self-pay | Admitting: Nurse Practitioner

## 2021-08-27 DIAGNOSIS — I10 Essential (primary) hypertension: Secondary | ICD-10-CM

## 2021-09-03 NOTE — Addendum Note (Signed)
Addended byBlane Ohara on: 09/03/2021 01:10 PM   Modules accepted: Level of Service

## 2021-09-07 ENCOUNTER — Other Ambulatory Visit: Payer: Self-pay | Admitting: Nurse Practitioner

## 2021-09-07 DIAGNOSIS — F900 Attention-deficit hyperactivity disorder, predominantly inattentive type: Secondary | ICD-10-CM

## 2021-09-07 MED ORDER — AMPHETAMINE-DEXTROAMPHET ER 30 MG PO CP24: 30.0000 mg | ORAL_CAPSULE | Freq: Every day | ORAL | 0 refills | Status: DC

## 2021-09-21 ENCOUNTER — Other Ambulatory Visit: Payer: Self-pay | Admitting: Nurse Practitioner

## 2021-09-21 DIAGNOSIS — I10 Essential (primary) hypertension: Secondary | ICD-10-CM

## 2021-10-09 ENCOUNTER — Other Ambulatory Visit: Payer: Self-pay | Admitting: Family Medicine

## 2021-10-09 DIAGNOSIS — F900 Attention-deficit hyperactivity disorder, predominantly inattentive type: Secondary | ICD-10-CM

## 2021-10-10 MED ORDER — AMPHETAMINE-DEXTROAMPHET ER 30 MG PO CP24: 30.0000 mg | ORAL_CAPSULE | Freq: Every day | ORAL | 0 refills | Status: DC

## 2021-10-21 ENCOUNTER — Other Ambulatory Visit: Payer: Self-pay | Admitting: Nurse Practitioner

## 2021-10-21 DIAGNOSIS — I10 Essential (primary) hypertension: Secondary | ICD-10-CM

## 2021-11-04 ENCOUNTER — Other Ambulatory Visit: Payer: Self-pay | Admitting: Nurse Practitioner

## 2021-11-04 DIAGNOSIS — E559 Vitamin D deficiency, unspecified: Secondary | ICD-10-CM

## 2021-11-11 ENCOUNTER — Other Ambulatory Visit: Payer: Self-pay | Admitting: Family Medicine

## 2021-11-11 DIAGNOSIS — F900 Attention-deficit hyperactivity disorder, predominantly inattentive type: Secondary | ICD-10-CM

## 2021-11-14 MED ORDER — AMPHETAMINE-DEXTROAMPHET ER 30 MG PO CP24: 30.0000 mg | ORAL_CAPSULE | Freq: Every day | ORAL | 0 refills | Status: DC

## 2021-11-17 ENCOUNTER — Other Ambulatory Visit: Payer: Self-pay | Admitting: Nurse Practitioner

## 2021-11-17 DIAGNOSIS — I10 Essential (primary) hypertension: Secondary | ICD-10-CM

## 2021-11-22 ENCOUNTER — Ambulatory Visit: Payer: BC Managed Care – PPO | Admitting: Family Medicine

## 2021-11-28 ENCOUNTER — Other Ambulatory Visit: Payer: Self-pay | Admitting: Family Medicine

## 2021-11-28 DIAGNOSIS — M791 Myalgia, unspecified site: Secondary | ICD-10-CM

## 2021-11-29 NOTE — Progress Notes (Unsigned)
Subjective:  Patient ID: Ashley Colon, female    DOB: January 28, 1961  Age: 61 y.o. MRN: [id number]  Chief Complaint  Patient presents with   Hypothyroidism   Hyperlipidemia    HPI Hypertension: Taking Amlodipine 5 mg daily, Losartan 100 mg daily  Urge incontinence: Taking Oxybutynin 15 mg daily.  Hypothyroidism: Takes Euthyrox 137 mcg daily.  ADHD: Currently Adderall 30 mg daily.  Hyperlipidemia: Vascepa 2g twice a day. Current Outpatient Medications on File Prior to Visit  Medication Sig Dispense Refill   amLODipine (NORVASC) 5 MG tablet Take 1 tablet by mouth once daily 30 tablet 0   amphetamine-dextroamphetamine (ADDERALL XR) 30 MG 24 hr capsule Take 1 capsule (30 mg total) by mouth daily. 30 capsule 0   EUTHYROX 137 MCG tablet Take 1 tablet by mouth once daily 90 tablet 1   icosapent Ethyl (VASCEPA) 1 g capsule Take 1 capsule by mouth twice daily 360 capsule 1   losartan (COZAAR) 100 MG tablet Take 1 tablet (100 mg total) by mouth daily. 90 tablet 1   meloxicam (MOBIC) 7.5 MG tablet Take 1 tablet by mouth twice daily 180 tablet 0   Multiple Vitamin (MULTIVITAMIN) tablet Take 1 tablet by mouth 2 (two) times daily.     oxybutynin (DITROPAN XL) 15 MG 24 hr tablet Take 1 tablet by mouth once daily 90 tablet 1   triamcinolone (KENALOG) 0.025 % ointment Apply 1 application. topically 2 (two) times daily. 30 g 0   Vitamin D, Ergocalciferol, (DRISDOL) 1.25 MG (50000 UNIT) CAPS capsule TAKE 1 CAPSULE BY MOUTH EVERY 7 DAYS 5 capsule 0   No current facility-administered medications on file prior to visit.   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   Overactive bladder 04/28/2019   Past Surgical History:  Procedure Laterality Date   CESAREAN SECTION  1999   CHOLECYSTECTOMY  1990   GASTRIC BYPASS  2008    Family History   Problem Relation Age of Onset   Pulmonary embolism Mother    Arthritis/Rheumatoid Mother    Coronary artery disease Father    Diverticulitis Father    Arthritis Father    Lung cancer Maternal Grandfather    Breast cancer Maternal Aunt    Social History   Socioeconomic History   Marital status: Married    Spouse name: Not on file   Number of children: 1   Years of education: Not on file   Highest education level: Not on file  Occupational History   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, nature  Tobacco Use   Smoking status: Never   Smokeless tobacco: Never  Vaping Use   Vaping Use: Never used  Substance and Sexual Activity   Alcohol use: Yes    Comment: rare   Drug use: Never   Sexual activity: Not Currently  Other Topics Concern   Not on file  Social History Narrative   Not on file   Social Determinants of Health   Financial Resource Strain: Low Risk  (05/31/2021)   Overall Financial Resource Strain (CARDIA)    Difficulty of Paying Living Expenses: Not hard at all  Food Insecurity: No Food Insecurity (05/31/2021)   Hunger Vital Sign    Worried About Running Out of Food in the Last Year: Never true    Ran Out of Food in the Last Year: Never true  Transportation Needs: No Transportation Needs (  05/31/2021)   PRAPARE - Administrator, Civil Service (Medical): No    Lack of Transportation (Non-Medical): No  Physical Activity: Sufficiently Active (05/31/2021)   Exercise Vital Sign    Days of Exercise per Week: 3 days    Minutes of Exercise per Session: 60 min  Stress: No Stress Concern Present (05/31/2021)   [employer] of Occupational Health - Occupational Stress Questionnaire    Feeling of Stress : Only a little  Social Connections: Moderately Isolated (05/31/2021)   Social Connection and Isolation Panel [NHANES]    Frequency of Communication with Friends and Family: More than three times a week    Frequency of Social Gatherings with Friends and Family: More than three  times a week    Attends Religious Services: Never    Database administrator or Organizations: No    Attends Banker Meetings: Never    Marital Status: Married    Review of Systems   Objective:  There were no vitals taken for this visit.     08/17/2021    8:26 AM 08/03/2021    8:06 AM 07/05/2021   10:34 AM  BP/Weight  Systolic BP 132 144 168  Diastolic BP 80 92 98  Wt. (Lbs) 201 202 202  BMI 37.36 kg/m2 37.55 kg/m2 37.55 kg/m2    Physical Exam  Diabetic Foot Exam - Simple   No data filed      Lab Results  Component Value Date   WBC 7.6 05/31/2021   HGB 14.2 05/31/2021   HCT 43.5 05/31/2021   PLT 347 05/31/2021   GLUCOSE 108 (H) 08/17/2021   CHOL 170 05/31/2021   TRIG 231 (H) 05/31/2021   HDL 29 (L) 05/31/2021   LDLCALC 101 (H) 05/31/2021   ALT 20 08/17/2021   AST 18 08/17/2021   NA 143 08/17/2021   K 4.9 08/17/2021   CL 104 08/17/2021   CREATININE 0.66 08/17/2021   BUN 12 08/17/2021   CO2 24 08/17/2021   TSH 1.170 05/31/2021   HGBA1C 5.6 06/03/2020      Assessment & Plan:   Problem List Items Addressed This Visit       Endocrine   Hypothyroidism due to acquired atrophy of thyroid - Primary     Genitourinary   Overactive bladder     Other   Attention deficit hyperactivity disorder (ADHD), predominantly inattentive type   Urge incontinence   Vitamin D insufficiency   Mixed hyperlipidemia  .  No orders of the defined types were placed in this encounter.   No orders of the defined types were placed in this encounter.    Follow-up: No follow-ups on file.  An After Visit Summary was printed and given to the patient.  Blane Ohara, MD [facility] [phone]

## 2021-11-30 ENCOUNTER — Encounter: Payer: Self-pay | Admitting: Family Medicine

## 2021-11-30 ENCOUNTER — Ambulatory Visit: Payer: BC Managed Care – PPO | Admitting: Family Medicine

## 2021-11-30 VITALS — BP 110/68 | HR 74 | Temp 97.2°F | Resp 16 | Ht 61.5 in | Wt 199.0 lb

## 2021-11-30 DIAGNOSIS — E559 Vitamin D deficiency, unspecified: Secondary | ICD-10-CM | POA: Diagnosis not present

## 2021-11-30 DIAGNOSIS — R7301 Impaired fasting glucose: Secondary | ICD-10-CM | POA: Diagnosis not present

## 2021-11-30 DIAGNOSIS — N3941 Urge incontinence: Secondary | ICD-10-CM | POA: Diagnosis not present

## 2021-11-30 DIAGNOSIS — F900 Attention-deficit hyperactivity disorder, predominantly inattentive type: Secondary | ICD-10-CM

## 2021-11-30 DIAGNOSIS — N3281 Overactive bladder: Secondary | ICD-10-CM

## 2021-11-30 DIAGNOSIS — M7121 Synovial cyst of popliteal space [Baker], right knee: Secondary | ICD-10-CM

## 2021-11-30 DIAGNOSIS — Z6836 Body mass index (BMI) 36.0-36.9, adult: Secondary | ICD-10-CM

## 2021-11-30 DIAGNOSIS — E034 Atrophy of thyroid (acquired): Secondary | ICD-10-CM

## 2021-11-30 DIAGNOSIS — E782 Mixed hyperlipidemia: Secondary | ICD-10-CM | POA: Diagnosis not present

## 2021-11-30 LAB — HEMOGLOBIN A1C
Est. average glucose Bld gHb Est-mCnc: 117 mg/dL
Hgb A1c MFr Bld: 5.7 % — ABNORMAL HIGH (ref 4.8–5.6)

## 2021-11-30 NOTE — Assessment & Plan Note (Signed)
Recommend continue to work on eating healthy diet and exercise.  

## 2021-11-30 NOTE — Assessment & Plan Note (Signed)
The current medical regimen is effective;  continue present plan and medications. Continue adderall 30 mg daily.

## 2021-11-30 NOTE — Assessment & Plan Note (Signed)
Previously well controlled Continue Synthroid at current dose  Recheck TSH and adjust Synthroid as indicated   

## 2021-11-30 NOTE — Assessment & Plan Note (Signed)
The current medical regimen is effective;  continue present plan and medications. Continue oxybutynin 15 mg daily.

## 2021-11-30 NOTE — Assessment & Plan Note (Signed)
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  Start on meloxicam 7.5 mg twice daily x 1-2 weeks.

## 2021-11-30 NOTE — Assessment & Plan Note (Signed)
Continue vitamin D 50K weekly. 

## 2021-11-30 NOTE — Assessment & Plan Note (Signed)
Well controlled.  No changes to medicines. Continue Vascepa 2g twice a day. Continue to work on eating a healthy diet and exercise.  Labs drawn today.

## 2021-12-01 LAB — CBC WITH DIFFERENTIAL/PLATELET
Basophils Absolute: 0.1 10*3/uL (ref 0.0–0.2)
Basos: 1 %
EOS (ABSOLUTE): 0.4 10*3/uL (ref 0.0–0.4)
Eos: 5 %
Hematocrit: 38.3 % (ref 34.0–46.6)
Hemoglobin: 12.9 g/dL (ref 11.1–15.9)
Immature Grans (Abs): 0 10*3/uL (ref 0.0–0.1)
Immature Granulocytes: 0 %
Lymphocytes Absolute: 2.4 10*3/uL (ref 0.7–3.1)
Lymphs: 31 %
MCH: 29.4 pg (ref 26.6–33.0)
MCHC: 33.7 g/dL (ref 31.5–35.7)
MCV: 87 fL (ref 79–97)
Monocytes Absolute: 0.6 10*3/uL (ref 0.1–0.9)
Monocytes: 8 %
Neutrophils Absolute: 4.4 10*3/uL (ref 1.4–7.0)
Neutrophils: 55 %
Platelets: 301 10*3/uL (ref 150–450)
RBC: 4.39 x10E6/uL (ref 3.77–5.28)
RDW: 13.5 % (ref 11.7–15.4)
WBC: 7.8 10*3/uL (ref 3.4–10.8)

## 2021-12-01 LAB — VITAMIN D 25 HYDROXY (VIT D DEFICIENCY, FRACTURES): Vit D, 25-Hydroxy: 41.6 ng/mL (ref 30.0–100.0)

## 2021-12-01 LAB — COMPREHENSIVE METABOLIC PANEL
ALT: 19 IU/L (ref 0–32)
AST: 21 IU/L (ref 0–40)
Albumin/Globulin Ratio: 1.9 (ref 1.2–2.2)
Albumin: 4.2 g/dL (ref 3.8–4.9)
Alkaline Phosphatase: 78 IU/L (ref 44–121)
BUN/Creatinine Ratio: 21 (ref 9–23)
BUN: 15 mg/dL (ref 6–24)
Bilirubin Total: 0.6 mg/dL (ref 0.0–1.2)
CO2: 24 mmol/L (ref 20–29)
Calcium: 9.2 mg/dL (ref 8.7–10.2)
Chloride: 104 mmol/L (ref 96–106)
Creatinine, Ser: 0.71 mg/dL (ref 0.57–1.00)
Globulin, Total: 2.2 g/dL (ref 1.5–4.5)
Glucose: 99 mg/dL (ref 70–99)
Potassium: 4.8 mmol/L (ref 3.5–5.2)
Sodium: 141 mmol/L (ref 134–144)
Total Protein: 6.4 g/dL (ref 6.0–8.5)
eGFR: 98 mL/min/{1.73_m2} (ref >59)

## 2021-12-01 LAB — LIPID PANEL
Chol/HDL Ratio: 5 ratio — ABNORMAL HIGH (ref 0.0–4.4)
Cholesterol, Total: 171 mg/dL (ref 100–199)
HDL: 34 mg/dL — ABNORMAL LOW (ref >39)
LDL Chol Calc (NIH): 112 mg/dL — ABNORMAL HIGH (ref 0–99)
Triglycerides: 141 mg/dL (ref 0–149)
VLDL Cholesterol Cal: 25 mg/dL (ref 5–40)

## 2021-12-01 LAB — TSH: TSH: 6.86 u[IU]/mL — ABNORMAL HIGH (ref 0.450–4.500)

## 2021-12-01 LAB — CARDIOVASCULAR RISK ASSESSMENT

## 2021-12-01 NOTE — Progress Notes (Signed)
Blood count normal.  Liver function normal.  Kidney function normal.  Thyroid function abnormal. Increase euthyrox to 150 mcg once daily.  Cholesterol: LDL increased to 112. Goal is less than 100. Recommend continue to work on eating healthy diet and exercise. HBA1C: 5.7 good. Vitamin D normal.

## 2021-12-02 ENCOUNTER — Other Ambulatory Visit: Payer: Self-pay

## 2021-12-02 DIAGNOSIS — E034 Atrophy of thyroid (acquired): Secondary | ICD-10-CM

## 2021-12-02 MED ORDER — EUTHYROX 150 MCG PO TABS: 150.0000 ug | ORAL_TABLET | Freq: Every day | ORAL | 0 refills | Status: DC

## 2021-12-09 ENCOUNTER — Other Ambulatory Visit: Payer: Self-pay | Admitting: Nurse Practitioner

## 2021-12-09 DIAGNOSIS — E559 Vitamin D deficiency, unspecified: Secondary | ICD-10-CM

## 2021-12-12 ENCOUNTER — Other Ambulatory Visit: Payer: Self-pay | Admitting: Family Medicine

## 2021-12-12 DIAGNOSIS — F900 Attention-deficit hyperactivity disorder, predominantly inattentive type: Secondary | ICD-10-CM

## 2021-12-12 MED ORDER — AMPHETAMINE-DEXTROAMPHET ER 30 MG PO CP24: 30.0000 mg | ORAL_CAPSULE | Freq: Every day | ORAL | 0 refills | Status: DC

## 2021-12-17 ENCOUNTER — Other Ambulatory Visit: Payer: Self-pay | Admitting: Nurse Practitioner

## 2021-12-17 DIAGNOSIS — I10 Essential (primary) hypertension: Secondary | ICD-10-CM

## 2021-12-20 ENCOUNTER — Other Ambulatory Visit: Payer: Self-pay | Admitting: Nurse Practitioner

## 2021-12-20 DIAGNOSIS — I10 Essential (primary) hypertension: Secondary | ICD-10-CM

## 2022-01-16 ENCOUNTER — Other Ambulatory Visit: Payer: Self-pay | Admitting: Family Medicine

## 2022-01-16 DIAGNOSIS — F900 Attention-deficit hyperactivity disorder, predominantly inattentive type: Secondary | ICD-10-CM

## 2022-01-16 DIAGNOSIS — N3941 Urge incontinence: Secondary | ICD-10-CM

## 2022-01-16 MED ORDER — AMPHETAMINE-DEXTROAMPHET ER 30 MG PO CP24: 30.0000 mg | ORAL_CAPSULE | Freq: Every day | ORAL | 0 refills | Status: DC

## 2022-01-22 ENCOUNTER — Other Ambulatory Visit: Payer: Self-pay | Admitting: Nurse Practitioner

## 2022-01-22 DIAGNOSIS — E559 Vitamin D deficiency, unspecified: Secondary | ICD-10-CM

## 2022-01-24 ENCOUNTER — Other Ambulatory Visit: Payer: Self-pay

## 2022-01-24 DIAGNOSIS — I10 Essential (primary) hypertension: Secondary | ICD-10-CM

## 2022-01-24 MED ORDER — AMLODIPINE BESYLATE 5 MG PO TABS: 5.0000 mg | ORAL_TABLET | Freq: Every day | ORAL | 1 refills | Status: DC

## 2022-02-17 ENCOUNTER — Other Ambulatory Visit: Payer: Self-pay | Admitting: Family Medicine

## 2022-02-17 DIAGNOSIS — F900 Attention-deficit hyperactivity disorder, predominantly inattentive type: Secondary | ICD-10-CM

## 2022-02-19 ENCOUNTER — Other Ambulatory Visit: Payer: Self-pay | Admitting: Physician Assistant

## 2022-02-19 DIAGNOSIS — M791 Myalgia, unspecified site: Secondary | ICD-10-CM

## 2022-02-20 MED ORDER — AMPHETAMINE-DEXTROAMPHET ER 30 MG PO CP24: 30.0000 mg | ORAL_CAPSULE | Freq: Every day | ORAL | 0 refills | Status: DC

## 2022-02-20 MED ORDER — EUTHYROX 150 MCG PO TABS: 150.0000 ug | ORAL_TABLET | Freq: Every day | ORAL | 0 refills | Status: DC

## 2022-02-23 ENCOUNTER — Other Ambulatory Visit: Payer: Self-pay | Admitting: Family Medicine

## 2022-02-24 ENCOUNTER — Other Ambulatory Visit: Payer: Self-pay | Admitting: Nurse Practitioner

## 2022-02-24 DIAGNOSIS — E559 Vitamin D deficiency, unspecified: Secondary | ICD-10-CM

## 2022-03-05 ENCOUNTER — Other Ambulatory Visit: Payer: Self-pay | Admitting: Nurse Practitioner

## 2022-03-05 DIAGNOSIS — E559 Vitamin D deficiency, unspecified: Secondary | ICD-10-CM

## 2022-03-19 ENCOUNTER — Other Ambulatory Visit: Payer: Self-pay | Admitting: Nurse Practitioner

## 2022-03-19 DIAGNOSIS — I10 Essential (primary) hypertension: Secondary | ICD-10-CM

## 2022-03-23 ENCOUNTER — Other Ambulatory Visit: Payer: Self-pay | Admitting: Nurse Practitioner

## 2022-03-23 ENCOUNTER — Other Ambulatory Visit: Payer: Self-pay | Admitting: Family Medicine

## 2022-03-23 DIAGNOSIS — F900 Attention-deficit hyperactivity disorder, predominantly inattentive type: Secondary | ICD-10-CM

## 2022-03-23 DIAGNOSIS — E559 Vitamin D deficiency, unspecified: Secondary | ICD-10-CM

## 2022-03-24 MED ORDER — AMPHETAMINE-DEXTROAMPHET ER 30 MG PO CP24: 30.0000 mg | ORAL_CAPSULE | Freq: Every day | ORAL | 0 refills | Status: DC

## 2022-04-04 ENCOUNTER — Encounter: Payer: Self-pay | Admitting: Family Medicine

## 2022-04-04 ENCOUNTER — Ambulatory Visit: Payer: BC Managed Care – PPO | Admitting: Family Medicine

## 2022-04-04 VITALS — BP 110/70 | HR 72 | Temp 97.3°F | Ht 61.5 in | Wt 193.2 lb

## 2022-04-04 DIAGNOSIS — E034 Atrophy of thyroid (acquired): Secondary | ICD-10-CM

## 2022-04-04 DIAGNOSIS — F900 Attention-deficit hyperactivity disorder, predominantly inattentive type: Secondary | ICD-10-CM | POA: Diagnosis not present

## 2022-04-04 DIAGNOSIS — E782 Mixed hyperlipidemia: Secondary | ICD-10-CM | POA: Diagnosis not present

## 2022-04-04 DIAGNOSIS — Z6835 Body mass index (BMI) 35.0-35.9, adult: Secondary | ICD-10-CM

## 2022-04-04 DIAGNOSIS — E559 Vitamin D deficiency, unspecified: Secondary | ICD-10-CM

## 2022-04-04 DIAGNOSIS — R7301 Impaired fasting glucose: Secondary | ICD-10-CM

## 2022-04-04 DIAGNOSIS — Z1231 Encounter for screening mammogram for malignant neoplasm of breast: Secondary | ICD-10-CM

## 2022-04-04 DIAGNOSIS — I10 Essential (primary) hypertension: Secondary | ICD-10-CM | POA: Diagnosis not present

## 2022-04-04 MED ORDER — VITAMIN D (ERGOCALCIFEROL) 1.25 MG (50000 UNIT) PO CAPS: 50000.0000 [IU] | ORAL_CAPSULE | ORAL | 3 refills | Status: DC

## 2022-04-04 NOTE — Assessment & Plan Note (Signed)
Recommend continue to work on eating healthy diet and exercise.  

## 2022-04-04 NOTE — Assessment & Plan Note (Signed)
Well controlled.  ?No changes to medicines.  ?Continue to work on eating a healthy diet and exercise.  ?Labs drawn today.  ?

## 2022-04-04 NOTE — Assessment & Plan Note (Addendum)
Previously not at goal. Changed dose.  Recheck TSH and adjust Synthroid as indicated

## 2022-04-04 NOTE — Assessment & Plan Note (Signed)
Check A1c at next visit.  Recommend continue to work on eating healthy diet and exercise.

## 2022-04-04 NOTE — Assessment & Plan Note (Signed)
Previously well controlled Continue current dose  Recheck today

## 2022-04-04 NOTE — Progress Notes (Signed)
Subjective:  Patient ID: Ashley Colon, female    DOB: Dec 04, 1960  Age: 62 y.o. MRN: [id number]  Chief Complaint  Patient presents with   Hypertension   Hypothyroidism    HPI Hypertension: Taking Amlodipine 5 mg daily, Losartan 100 mg daily  Urge incontinence: Taking Oxybutynin 15 mg daily. Works Engineer, manufacturing.   Hypothyroidism: Takes Euthyrox 150 mcg daily.  ADHD: Currently Adderall 30 mg daily. Helps her focus.  Hyperlipidemia: Vascepa 2g twice a day. Eating healthy. Not exercising enough.  Vitamin D deficiency: Taking vitamin D 50K weekly.    Current Outpatient Medications on File Prior to Visit  Medication Sig Dispense Refill   amLODipine (NORVASC) 5 MG tablet Take 1 tablet (5 mg total) by mouth daily. 90 tablet 1   amphetamine-dextroamphetamine (ADDERALL XR) 30 MG 24 hr capsule Take 1 capsule (30 mg total) by mouth daily. 30 capsule 0   icosapent Ethyl (VASCEPA) 1 g capsule Take 1 capsule by mouth twice daily 360 capsule 1   levothyroxine (EUTHYROX) 150 MCG tablet Take 1 tablet (150 mcg total) by mouth daily before breakfast. 90 tablet 0   losartan (COZAAR) 100 MG tablet Take 1 tablet by mouth once daily 90 tablet 0   meloxicam (MOBIC) 7.5 MG tablet Take 1 tablet by mouth twice daily 180 tablet 0   Multiple Vitamin (MULTIVITAMIN) tablet Take 1 tablet by mouth 2 (two) times daily.     oxybutynin (DITROPAN XL) 15 MG 24 hr tablet Take 1 tablet by mouth once daily 90 tablet 0   triamcinolone (KENALOG) 0.025 % ointment Apply 1 application. topically 2 (two) times daily. 30 g 0   No current facility-administered medications on file prior to visit.   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   Overactive bladder 04/28/2019   Past Surgical History:  Procedure Laterality Date   [place]    GASTRIC BYPASS  2008    Family History  Problem Relation Age of Onset   Pulmonary embolism Mother    Arthritis/Rheumatoid Mother    Coronary artery disease Father    Diverticulitis Father    Arthritis Father    Lung cancer Maternal Grandfather    Breast cancer Maternal Aunt    Social History   Socioeconomic History   Marital status: Married    Spouse name: Not on file   Number of children: 1   Years of education: Not on file   Highest education level: Not on file  Occupational History   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, water quality  Tobacco Use   Smoking status: Never   Smokeless tobacco: Never  Vaping Use   Vaping Use: Never used  Substance and Sexual Activity   Alcohol use: Yes    Comment: rare   Drug use: Never   Sexual activity: Not Currently  Other Topics Concern   Not on file  Social History Narrative   Not on file   Social Determinants of Health   Financial Resource Strain: Low Risk  (05/31/2021)   Overall Financial Resource Strain (CARDIA)    Difficulty of Paying Living Expenses: Not hard at all  Food Insecurity: No Food Insecurity (05/31/2021)   Hunger Vital Sign    Worried About Running Out of Food in the Last Year: Never true    [place] in the Last Year: Never true  Transportation Needs: No Transportation Needs (05/31/2021)   PRAPARE - Administrator, Civil Service (Medical): No    Lack of Transportation (Non-Medical): No  Physical Activity: Sufficiently Active (05/31/2021)   Exercise Vital Sign    Days of Exercise per Week: 3 days    Minutes of Exercise per Session: 60 min  Stress: No Stress Concern Present (05/31/2021)   [employer] of Occupational Health - Occupational Stress Questionnaire    Feeling of Stress : Only a little  Social Connections: Moderately Isolated (05/31/2021)   Social Connection and Isolation Panel [NHANES]    Frequency of Communication with Friends and Family: More than three times a week    Frequency of Social Gatherings  with Friends and Family: More than three times a week    Attends Religious Services: Never    Database administrator or Organizations: No    Attends Banker Meetings: Never    Marital Status: Married    Review of Systems  Constitutional:  Negative for fatigue.  HENT:  Negative for congestion, ear pain and sore throat.   Respiratory:  Positive for cough. Negative for shortness of breath.   Cardiovascular:  Negative for chest pain.  Gastrointestinal:  Negative for abdominal pain, constipation, diarrhea, nausea and vomiting.  Genitourinary:  Negative for dysuria, frequency and urgency.  Musculoskeletal:  Negative for arthralgias, back pain and myalgias.  Neurological:  Negative for dizziness and headaches.  Psychiatric/Behavioral:  Negative for agitation and sleep disturbance. The patient is not nervous/anxious.       04/04/2022    7:33 AM 12/28/2020    2:13 PM 06/03/2020    8:09 AM  PHQ9 SCORE ONLY  PHQ-9 Total Score 0 0 0      Objective:  BP 110/70 (BP Location: Left Arm, Patient Position: Sitting, Cuff Size: Large)   Pulse 72   Temp (!) 97.3 F (36.3 C) (Temporal)   Ht 5' 1.5" (1.562 m)   Wt 193 lb 3.2 oz (87.6 kg)   SpO2 99%   BMI 35.91 kg/m      04/04/2022    7:30 AM 11/30/2021    7:35 AM 08/17/2021    8:26 AM  BP/Weight  Systolic BP 110 110 132  Diastolic BP 70 68 80  Wt. (Lbs) 193.2 199 201  BMI 35.91 kg/m2 36.99 kg/m2 37.36 kg/m2    Physical Exam Vitals reviewed.  Constitutional:      Appearance: Normal appearance. She is obese.  Neck:     Vascular: No carotid bruit.  Cardiovascular:     Rate and Rhythm: Normal rate and regular rhythm.     Heart sounds: Normal heart sounds.  Pulmonary:     Effort: Pulmonary effort is normal. No respiratory distress.     Breath sounds: Normal breath sounds.  Abdominal:     General: Abdomen is flat. Bowel sounds are normal.     Palpations: Abdomen is soft.     Tenderness: There is no abdominal tenderness.   Neurological:     Mental Status: She is alert and oriented to person, place, and time.  Psychiatric:        Mood and Affect: Mood normal.        Behavior: Behavior normal.     Diabetic Foot Exam - Simple   No data filed      Lab Results  Component Value Date   WBC 7.8 11/30/2021   HGB 12.9 11/30/2021   HCT 38.3 11/30/2021   PLT 301  11/30/2021   GLUCOSE 99 11/30/2021   CHOL 171 11/30/2021   TRIG 141 11/30/2021   HDL 34 (L) 11/30/2021   LDLCALC 112 (H) 11/30/2021   ALT 19 11/30/2021   AST 21 11/30/2021   NA 141 11/30/2021   K 4.8 11/30/2021   CL 104 11/30/2021   CREATININE 0.71 11/30/2021   BUN 15 11/30/2021   CO2 24 11/30/2021   TSH 6.860 (H) 11/30/2021   HGBA1C 5.7 (H) 11/30/2021      Assessment & Plan:   Problem List Items Addressed This Visit       Cardiovascular and Mediastinum   Essential hypertension    Well controlled.  No changes to medicines.  Continue to work on eating a healthy diet and exercise.  Labs drawn today.        Relevant Orders   CBC With Diff/Platelet   Comprehensive metabolic panel     Endocrine   Hypothyroidism due to acquired atrophy of thyroid - Primary    Previously not at goal. Changed dose.  Recheck TSH and adjust Synthroid as indicated         Relevant Orders   TSH   Impaired fasting glucose    Check A1c at next visit.  Recommend continue to work on eating healthy diet and exercise.         Other   Attention deficit hyperactivity disorder (ADHD), predominantly inattentive type    The current medical regimen is effective;  continue present plan and medications.       Severe obesity with body mass index (BMI) of 35.0 to 35.9 and comorbidity (HCC)    Recommend continue to work on eating healthy diet and exercise       Vitamin D insufficiency    Previously well controlled Continue current dose  Recheck today       Mixed hyperlipidemia    Fairly Well controlled.  No changes to medicines. Consider  addition of statin. Continue to work on eating a healthy diet and exercise.  Labs drawn today.         Relevant Orders   Lipid panel   Other Visit Diagnoses     Screening mammogram for breast cancer       Relevant Orders   MM DIGITAL SCREENING BILATERAL   Vitamin D deficiency       Relevant Medications   Vitamin D, Ergocalciferol, (DRISDOL) 1.25 MG (50000 UNIT) CAPS capsule     .  Meds ordered this encounter  Medications   Vitamin D, Ergocalciferol, (DRISDOL) 1.25 MG (50000 UNIT) CAPS capsule    Sig: Take 1 capsule (50,000 Units total) by mouth once a week.    Dispense:  12 capsule    Refill:  3    Orders Placed This Encounter  Procedures   MM DIGITAL SCREENING BILATERAL   CBC With Diff/Platelet   Comprehensive metabolic panel   Lipid panel   TSH     Follow-up: Return in about 3 months (around 07/04/2022) for chronic fasting.  An After Visit Summary was printed and given to the patient.  Blane Ohara, MD [facility] [phone]

## 2022-04-04 NOTE — Assessment & Plan Note (Addendum)
Fairly Well controlled.  No changes to medicines. Consider addition of statin. Continue to work on eating a healthy diet and exercise.  Labs drawn today.

## 2022-04-04 NOTE — Assessment & Plan Note (Signed)
The current medical regimen is effective;  continue present plan and medications.  

## 2022-04-05 LAB — CARDIOVASCULAR RISK ASSESSMENT

## 2022-04-05 LAB — LIPID PANEL
Chol/HDL Ratio: 5.3 ratio — ABNORMAL HIGH (ref 0.0–4.4)
Cholesterol, Total: 160 mg/dL (ref 100–199)
HDL: 30 mg/dL — ABNORMAL LOW (ref >39)
LDL Chol Calc (NIH): 99 mg/dL (ref 0–99)
Triglycerides: 176 mg/dL — ABNORMAL HIGH (ref 0–149)
VLDL Cholesterol Cal: 31 mg/dL (ref 5–40)

## 2022-04-05 LAB — COMPREHENSIVE METABOLIC PANEL
ALT: 20 IU/L (ref 0–32)
AST: 24 IU/L (ref 0–40)
Albumin/Globulin Ratio: 1.8 (ref 1.2–2.2)
Albumin: 4.1 g/dL (ref 3.8–4.9)
Alkaline Phosphatase: 75 IU/L (ref 44–121)
BUN/Creatinine Ratio: 17 (ref 9–23)
BUN: 10 mg/dL (ref 6–24)
Bilirubin Total: 0.4 mg/dL (ref 0.0–1.2)
CO2: 22 mmol/L (ref 20–29)
Calcium: 9.3 mg/dL (ref 8.7–10.2)
Chloride: 106 mmol/L (ref 96–106)
Creatinine, Ser: 0.6 mg/dL (ref 0.57–1.00)
Globulin, Total: 2.3 g/dL (ref 1.5–4.5)
Glucose: 108 mg/dL — ABNORMAL HIGH (ref 70–99)
Potassium: 4.6 mmol/L (ref 3.5–5.2)
Sodium: 144 mmol/L (ref 134–144)
Total Protein: 6.4 g/dL (ref 6.0–8.5)
eGFR: 103 mL/min/{1.73_m2} (ref >59)

## 2022-04-05 LAB — CBC WITH DIFF/PLATELET
Basophils Absolute: 0 10*3/uL (ref 0.0–0.2)
Basos: 0 %
EOS (ABSOLUTE): 0.5 10*3/uL — ABNORMAL HIGH (ref 0.0–0.4)
Eos: 7 %
Hematocrit: 37.2 % (ref 34.0–46.6)
Hemoglobin: 11.9 g/dL (ref 11.1–15.9)
Immature Grans (Abs): 0 10*3/uL (ref 0.0–0.1)
Immature Granulocytes: 1 %
Lymphocytes Absolute: 2.2 10*3/uL (ref 0.7–3.1)
Lymphs: 31 %
MCH: 27.3 pg (ref 26.6–33.0)
MCHC: 32 g/dL (ref 31.5–35.7)
MCV: 85 fL (ref 79–97)
Monocytes Absolute: 0.5 10*3/uL (ref 0.1–0.9)
Monocytes: 7 %
Neutrophils Absolute: 3.9 10*3/uL (ref 1.4–7.0)
Neutrophils: 54 %
Platelets: 348 10*3/uL (ref 150–450)
RBC: 4.36 x10E6/uL (ref 3.77–5.28)
RDW: 13.5 % (ref 11.7–15.4)
WBC: 7.1 10*3/uL (ref 3.4–10.8)

## 2022-04-05 LAB — TSH: TSH: 2.23 u[IU]/mL (ref 0.450–4.500)

## 2022-04-05 NOTE — Progress Notes (Signed)
Blood count normal.  Liver function normal.  Kidney function normal.  Thyroid function normal.  Cholesterol: trigs up a little. LDL improved to 99. HDL low.  Recommend continue to work on eating healthy diet and exercise. Add on HBA1C.

## 2022-04-10 LAB — SPECIMEN STATUS REPORT

## 2022-04-10 LAB — HGB A1C W/O EAG: Hgb A1c MFr Bld: 5.5 % (ref 4.8–5.6)

## 2022-04-20 ENCOUNTER — Other Ambulatory Visit: Payer: Self-pay | Admitting: Family Medicine

## 2022-04-20 DIAGNOSIS — N3941 Urge incontinence: Secondary | ICD-10-CM

## 2022-04-23 ENCOUNTER — Other Ambulatory Visit: Payer: Self-pay | Admitting: Family Medicine

## 2022-04-23 DIAGNOSIS — F900 Attention-deficit hyperactivity disorder, predominantly inattentive type: Secondary | ICD-10-CM

## 2022-04-24 MED ORDER — AMPHETAMINE-DEXTROAMPHET ER 30 MG PO CP24: 30.0000 mg | ORAL_CAPSULE | Freq: Every day | ORAL | 0 refills | Status: DC

## 2022-05-24 ENCOUNTER — Other Ambulatory Visit: Payer: Self-pay | Admitting: Family Medicine

## 2022-05-24 DIAGNOSIS — F900 Attention-deficit hyperactivity disorder, predominantly inattentive type: Secondary | ICD-10-CM

## 2022-05-24 MED ORDER — AMPHETAMINE-DEXTROAMPHET ER 30 MG PO CP24: 30.0000 mg | ORAL_CAPSULE | Freq: Every day | ORAL | 0 refills | Status: DC

## 2022-06-01 ENCOUNTER — Other Ambulatory Visit: Payer: Self-pay | Admitting: Family Medicine

## 2022-06-02 ENCOUNTER — Other Ambulatory Visit: Payer: Self-pay | Admitting: Family Medicine

## 2022-06-02 MED ORDER — LEVOTHYROXINE SODIUM 150 MCG PO TABS: 150.0000 ug | ORAL_TABLET | Freq: Every day | ORAL | 3 refills | Status: DC

## 2022-06-21 ENCOUNTER — Other Ambulatory Visit: Payer: Self-pay

## 2022-06-21 DIAGNOSIS — I10 Essential (primary) hypertension: Secondary | ICD-10-CM

## 2022-06-21 MED ORDER — LOSARTAN POTASSIUM 100 MG PO TABS: 100.0000 mg | ORAL_TABLET | Freq: Every day | ORAL | 0 refills | Status: DC

## 2022-06-30 ENCOUNTER — Other Ambulatory Visit: Payer: Self-pay | Admitting: Family Medicine

## 2022-06-30 DIAGNOSIS — M791 Myalgia, unspecified site: Secondary | ICD-10-CM

## 2022-07-04 ENCOUNTER — Other Ambulatory Visit: Payer: Self-pay

## 2022-07-05 ENCOUNTER — Other Ambulatory Visit: Payer: Self-pay

## 2022-07-05 ENCOUNTER — Ambulatory Visit
Admission: RE | Admit: 2022-07-05 | Discharge: 2022-07-05 | Disposition: A | Discharge status: Home / Self Care | Payer: BC Managed Care – PPO | Source: Ambulatory Visit | Attending: Family Medicine | Admitting: Family Medicine

## 2022-07-05 DIAGNOSIS — M791 Myalgia, unspecified site: Secondary | ICD-10-CM

## 2022-07-05 DIAGNOSIS — Z1231 Encounter for screening mammogram for malignant neoplasm of breast: Secondary | ICD-10-CM

## 2022-07-05 MED ORDER — MELOXICAM 7.5 MG PO TABS: 7.5000 mg | ORAL_TABLET | Freq: Two times a day (BID) | ORAL | 0 refills | Status: DC

## 2022-07-06 ENCOUNTER — Ambulatory Visit: Payer: BC Managed Care – PPO

## 2022-07-11 ENCOUNTER — Ambulatory Visit: Payer: BC Managed Care – PPO | Admitting: Family Medicine

## 2022-07-11 ENCOUNTER — Other Ambulatory Visit: Payer: Self-pay | Admitting: Family Medicine

## 2022-07-11 VITALS — BP 124/70 | HR 68 | Temp 97.3°F | Ht 61.5 in | Wt 197.0 lb

## 2022-07-11 DIAGNOSIS — G5603 Carpal tunnel syndrome, bilateral upper limbs: Secondary | ICD-10-CM

## 2022-07-11 DIAGNOSIS — F900 Attention-deficit hyperactivity disorder, predominantly inattentive type: Secondary | ICD-10-CM | POA: Diagnosis not present

## 2022-07-11 DIAGNOSIS — E034 Atrophy of thyroid (acquired): Secondary | ICD-10-CM | POA: Diagnosis not present

## 2022-07-11 DIAGNOSIS — I10 Essential (primary) hypertension: Secondary | ICD-10-CM | POA: Diagnosis not present

## 2022-07-11 DIAGNOSIS — R7301 Impaired fasting glucose: Secondary | ICD-10-CM | POA: Diagnosis not present

## 2022-07-11 DIAGNOSIS — Z1211 Encounter for screening for malignant neoplasm of colon: Secondary | ICD-10-CM

## 2022-07-11 DIAGNOSIS — E782 Mixed hyperlipidemia: Secondary | ICD-10-CM

## 2022-07-11 DIAGNOSIS — R5383 Other fatigue: Secondary | ICD-10-CM

## 2022-07-11 DIAGNOSIS — R202 Paresthesia of skin: Secondary | ICD-10-CM | POA: Diagnosis not present

## 2022-07-11 DIAGNOSIS — E559 Vitamin D deficiency, unspecified: Secondary | ICD-10-CM

## 2022-07-11 MED ORDER — AMPHETAMINE-DEXTROAMPHET ER 30 MG PO CP24
30.0000 mg | ORAL_CAPSULE | Freq: Every day | ORAL | 0 refills | Status: DC
Start: 2022-07-11 — End: 2022-08-09

## 2022-07-11 NOTE — Progress Notes (Signed)
Subjective:  Patient ID: Ashley Colon, female    DOB: 04/06/1960  Age: 62 y.o. MRN: [id number]  Chief Complaint  Patient presents with   Hypothyroidism   Hypertension   HPI Hypertension: Taking Amlodipine 5 mg daily, Losartan 100 mg daily   Urge incontinence: Taking Oxybutynin 15 mg daily. Works Firefighter.    Hypothyroidism: Takes Euthyrox 150 mcg daily.   ADHD: Currently Adderall 30 mg daily. Helps her focus.   Hyperlipidemia: Vascepa 2g twice a day. Eating healthy. Not exercising enough.   Vitamin D deficiency: Taking vitamin D 50K weekly.   C/o being fatigue, sleeps 6-7 hours a night, work shift is 4 am to 1 pm. Will take  a nap through out the day.        07/11/2022    7:37 AM 04/04/2022    7:33 AM 12/28/2020    2:13 PM 06/03/2020    8:09 AM 06/12/2019    9:59 PM  Depression screen PHQ 2/9  Decreased Interest 0 0 0 0 0  Down, Depressed, Hopeless 0 0 0 0 0  PHQ - 2 Score 0 0 0 0 0  Altered sleeping    0   Tired, decreased energy    0   Change in appetite    0   Feeling bad or failure about yourself     0   Trouble concentrating    0   Moving slowly or fidgety/restless    0   Suicidal thoughts    0   PHQ-9 Score    0   Difficult doing work/chores    Not difficult at all          05/25/2020    8:59 AM 06/03/2020    8:09 AM 12/28/2020    2:10 PM 04/04/2022    7:33 AM 07/11/2022    7:36 AM  Fall Risk  Falls in the past year? 0 0 0 0 0  Was there an injury with Fall? 0 0 0 0 0  Fall Risk Category Calculator 0 0 0 0 0  Fall Risk Category (Retired) Low Low Low Low   (RETIRED) Patient Fall Risk Level Low fall risk Low fall risk Low fall risk Low fall risk   Patient at Risk for Falls Due to No Fall Risks No Fall Risks  No Fall Risks No Fall Risks  Fall risk Follow up Falls evaluation completed Falls evaluation completed  Falls evaluation completed Falls evaluation completed      Review of Systems  Constitutional:  Positive for fatigue.  HENT:  Negative for  congestion, ear pain and sore throat.   Respiratory:  Negative for cough and shortness of breath.   Cardiovascular:  Negative for chest pain.  Gastrointestinal:  Negative for abdominal pain, constipation, diarrhea, nausea and vomiting.  Genitourinary:  Negative for dysuria, frequency and urgency.  Musculoskeletal:  Negative for arthralgias, back pain and myalgias.  Neurological:  Negative for dizziness and headaches.  Psychiatric/Behavioral:  Negative for agitation and sleep disturbance. The patient is not nervous/anxious.     Current Outpatient Medications on File Prior to Visit  Medication Sig Dispense Refill   amLODipine (NORVASC) 5 MG tablet Take 1 tablet (5 mg total) by mouth daily. 90 tablet 1   icosapent Ethyl (VASCEPA) 1 g capsule Take 1 capsule by mouth twice daily 360 capsule 1   levothyroxine (EUTHYROX) 150 MCG tablet Take 1 tablet (150 mcg total) by mouth daily before breakfast. 90 tablet 3   losartan (COZAAR) 100  MG tablet Take 1 tablet (100 mg total) by mouth daily. 90 tablet 0   meloxicam (MOBIC) 7.5 MG tablet Take 1 tablet (7.5 mg total) by mouth 2 (two) times daily. 180 tablet 0   Multiple Vitamin (MULTIVITAMIN) tablet Take 1 tablet by mouth 2 (two) times daily.     oxybutynin (DITROPAN XL) 15 MG 24 hr tablet Take 1 tablet by mouth once daily 90 tablet 0   triamcinolone (KENALOG) 0.025 % ointment Apply 1 application. topically 2 (two) times daily. 30 g 0   Vitamin D, Ergocalciferol, (DRISDOL) 1.25 MG (50000 UNIT) CAPS capsule Take 1 capsule (50,000 Units total) by mouth once a week. 12 capsule 3   No current facility-administered medications on file prior to visit.   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   Overactive bladder 04/28/2019   Past Surgical History:  Procedure Laterality Date   CESAREAN SECTION   1999   CHOLECYSTECTOMY  1990   GASTRIC BYPASS  2008    Family History  Problem Relation Age of Onset   Pulmonary embolism Mother    Arthritis/Rheumatoid Mother    Coronary artery disease Father    Diverticulitis Father    Arthritis Father    Lung cancer Maternal Grandfather    Breast cancer Maternal Aunt    Social History   Socioeconomic History   Marital status: Married    Spouse name: Not on file   Number of children: 1   Years of education: Not on file   Highest education level: Not on file  Occupational History   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, nature  Tobacco Use   Smoking status: Never   Smokeless tobacco: Never  Vaping Use   Vaping Use: Never used  Substance and Sexual Activity   Alcohol use: Yes    Comment: rare   Drug use: Never   Sexual activity: Not Currently  Other Topics Concern   Not on file  Social History Narrative   Not on file   Social Determinants of Health   Financial Resource Strain: Low Risk  (05/31/2021)   Overall Financial Resource Strain (CARDIA)    Difficulty of Paying Living Expenses: Not hard at all  Food Insecurity: No Food Insecurity (05/31/2021)   Hunger Vital Sign    Worried About Running Out of Food in the Last Year: Never true    Ran Out of Food in the Last Year: Never true  Transportation Needs: No Transportation Needs (05/31/2021)   PRAPARE - Administrator, Civil Service (Medical): No    Lack of Transportation (Non-Medical): No  Physical Activity: Sufficiently Active (05/31/2021)   Exercise Vital Sign    Days of Exercise per Week: 3 days    Minutes of Exercise per Session: 60 min  Stress: No Stress Concern Present (05/31/2021)   [employer] of Occupational Health - Occupational Stress Questionnaire    Feeling of Stress : Only a little  Social Connections: Moderately Isolated (05/31/2021)   Social Connection and Isolation Panel [NHANES]    Frequency of Communication with Friends and Family: More than three times a week     Frequency of Social Gatherings with Friends and Family: More than three times a week    Attends Religious Services: Never    Database administrator or Organizations: No    Attends Banker Meetings: Never  Marital Status: Married    Objective:  BP 124/70 (BP Location: Right Arm, Patient Position: Sitting, Cuff Size: Large)   Pulse 68   Temp (!) 97.3 F (36.3 C) (Temporal)   Ht 5' 1.5" (1.562 m)   Wt 197 lb (89.4 kg)   SpO2 99%   BMI 36.62 kg/m      07/11/2022    7:36 AM 04/04/2022    7:30 AM 11/30/2021    7:35 AM  BP/Weight  Systolic BP 124 110 110  Diastolic BP 70 70 68  Wt. (Lbs) 197 193.2 199  BMI 36.62 kg/m2 35.91 kg/m2 36.99 kg/m2    Physical Exam Vitals reviewed.  Constitutional:      Appearance: Normal appearance. She is obese.  Cardiovascular:     Rate and Rhythm: Normal rate and regular rhythm.     Heart sounds: Normal heart sounds.  Pulmonary:     Effort: Pulmonary effort is normal.     Breath sounds: Normal breath sounds.  Abdominal:     General: Abdomen is flat. Bowel sounds are normal.     Palpations: Abdomen is soft.     Tenderness: There is no abdominal tenderness.  Neurological:     Mental Status: She is alert and oriented to person, place, and time.     Comments: + tinel's BL and phalen's BL  right worse than left  Psychiatric:        Mood and Affect: Mood normal.        Behavior: Behavior normal.     Diabetic Foot Exam - Simple   No data filed      Lab Results  Component Value Date   WBC 7.2 07/11/2022   HGB 11.6 07/11/2022   HCT 36.2 07/11/2022   PLT 322 07/11/2022   GLUCOSE 103 (H) 07/11/2022   CHOL 157 07/11/2022   TRIG 97 07/11/2022   HDL 34 (L) 07/11/2022   LDLCALC 105 (H) 07/11/2022   ALT 18 07/11/2022   AST 18 07/11/2022   NA 142 07/11/2022   K 4.5 07/11/2022   CL 105 07/11/2022   CREATININE 0.61 07/11/2022   BUN 17 07/11/2022   CO2 23 07/11/2022   TSH 2.490 07/11/2022   HGBA1C 5.8 (H) 07/11/2022       Assessment & Plan:    Hypothyroidism due to acquired atrophy of thyroid Assessment & Plan: Previously well controlled Continue Synthroid at current dose  Recheck TSH and adjust Synthroid as indicated    Orders: -     TSH -     T4, free  Attention deficit hyperactivity disorder (ADHD), predominantly inattentive type Assessment & Plan: The current medical regimen is effective;  continue present plan and medications.  Currently Adderall 30 mg daily.    Vitamin D insufficiency Assessment & Plan: Check labs.  Orders: -     VITAMIN D 25 Hydroxy (Vit-D Deficiency, Fractures)  Essential hypertension Assessment & Plan: Well controlled.  No changes to medicines. Taking Amlodipine 5 mg daily, Losartan 100 mg daily  Continue to work on eating a healthy diet and exercise.  Labs drawn today.    Orders: -     CBC With Diff/Platelet -     Comprehensive metabolic panel  Mixed hyperlipidemia Assessment & Plan: Well controlled.  No changes to medicines. Vascepa 2g twice a day.  Continue to work on eating a healthy diet and exercise.  Labs drawn today.    Orders: -     Lipid panel -  Cardiovascular Risk Assessment  Screening for colon cancer -     Cologuard  Paresthesia Assessment & Plan: Check labs.  Orders: -     B12 and Folate Panel  Impaired fasting glucose Assessment & Plan: Hemoglobin A1c 5.5%, 3 month avg of blood sugars, is in prediabetic range.  In order to prevent progression to diabetes, recommend low carb diet and regular exercise   Orders: -     Hemoglobin A1c  Other fatigue Assessment & Plan: Check labs.   Bilateral carpal tunnel syndrome Assessment & Plan: Recommend carpal tunnel braces.  Education given.       No orders of the defined types were placed in this encounter.   Orders Placed This Encounter  Procedures   CBC With Diff/Platelet   Comprehensive metabolic panel   Lipid panel   TSH   Hemoglobin A1c   Vitamin D,  25-hydroxy   Cologuard   B12 and Folate Panel   T4, Free   Cardiovascular Risk Assessment     Follow-up: Return in about 6 months (around 01/17/2023) for cpe fasting.  I,Marla I Leal-Borjas,acting as a scribe for Blane Ohara, MD.,have documented all relevant documentation on the behalf of Blane Ohara, MD,as directed by  Blane Ohara, MD while in the presence of Blane Ohara, MD.   An After Visit Summary was printed and given to the patient.  I attest that I have reviewed this visit and agree with the plan scribed by my staff.   Blane Ohara, MD [facility] [phone]

## 2022-07-12 LAB — CBC WITH DIFF/PLATELET
Basophils Absolute: 0.1 10*3/uL (ref 0.0–0.2)
Basos: 1 %
EOS (ABSOLUTE): 0.4 10*3/uL (ref 0.0–0.4)
Eos: 5 %
Hematocrit: 36.2 % (ref 34.0–46.6)
Hemoglobin: 11.6 g/dL (ref 11.1–15.9)
Immature Grans (Abs): 0 10*3/uL (ref 0.0–0.1)
Immature Granulocytes: 0 %
Lymphocytes Absolute: 2.2 10*3/uL (ref 0.7–3.1)
Lymphs: 30 %
MCH: 26.1 pg — ABNORMAL LOW (ref 26.6–33.0)
MCHC: 32 g/dL (ref 31.5–35.7)
MCV: 82 fL (ref 79–97)
Monocytes Absolute: 0.6 10*3/uL (ref 0.1–0.9)
Monocytes: 8 %
Neutrophils Absolute: 4 10*3/uL (ref 1.4–7.0)
Neutrophils: 56 %
Platelets: 322 10*3/uL (ref 150–450)
RBC: 4.44 x10E6/uL (ref 3.77–5.28)
RDW: 13.4 % (ref 11.7–15.4)
WBC: 7.2 10*3/uL (ref 3.4–10.8)

## 2022-07-12 LAB — COMPREHENSIVE METABOLIC PANEL
ALT: 18 IU/L (ref 0–32)
AST: 18 IU/L (ref 0–40)
Albumin/Globulin Ratio: 1.8 (ref 1.2–2.2)
Albumin: 4.2 g/dL (ref 3.8–4.9)
Alkaline Phosphatase: 88 IU/L (ref 44–121)
BUN/Creatinine Ratio: 28 — ABNORMAL HIGH (ref 9–23)
BUN: 17 mg/dL (ref 6–24)
Bilirubin Total: 0.4 mg/dL (ref 0.0–1.2)
CO2: 23 mmol/L (ref 20–29)
Calcium: 9.1 mg/dL (ref 8.7–10.2)
Chloride: 105 mmol/L (ref 96–106)
Creatinine, Ser: 0.61 mg/dL (ref 0.57–1.00)
Globulin, Total: 2.4 g/dL (ref 1.5–4.5)
Glucose: 103 mg/dL — ABNORMAL HIGH (ref 70–99)
Potassium: 4.5 mmol/L (ref 3.5–5.2)
Sodium: 142 mmol/L (ref 134–144)
Total Protein: 6.6 g/dL (ref 6.0–8.5)
eGFR: 103 mL/min/{1.73_m2} (ref >59)

## 2022-07-12 LAB — LIPID PANEL
Chol/HDL Ratio: 4.6 ratio — ABNORMAL HIGH (ref 0.0–4.4)
Cholesterol, Total: 157 mg/dL (ref 100–199)
HDL: 34 mg/dL — ABNORMAL LOW (ref >39)
LDL Chol Calc (NIH): 105 mg/dL — ABNORMAL HIGH (ref 0–99)
Triglycerides: 97 mg/dL (ref 0–149)
VLDL Cholesterol Cal: 18 mg/dL (ref 5–40)

## 2022-07-12 LAB — CARDIOVASCULAR RISK ASSESSMENT

## 2022-07-12 LAB — B12 AND FOLATE PANEL
Folate: 18.7 ng/mL (ref >3.0)
Vitamin B-12: 1646 pg/mL — ABNORMAL HIGH (ref 232–1245)

## 2022-07-12 LAB — T4, FREE: Free T4: 1.29 ng/dL (ref 0.82–1.77)

## 2022-07-12 LAB — HEMOGLOBIN A1C
Est. average glucose Bld gHb Est-mCnc: 120 mg/dL
Hgb A1c MFr Bld: 5.8 % — ABNORMAL HIGH (ref 4.8–5.6)

## 2022-07-12 LAB — VITAMIN D 25 HYDROXY (VIT D DEFICIENCY, FRACTURES): Vit D, 25-Hydroxy: 42.8 ng/mL (ref 30.0–100.0)

## 2022-07-12 LAB — TSH: TSH: 2.49 u[IU]/mL (ref 0.450–4.500)

## 2022-07-14 DIAGNOSIS — R202 Paresthesia of skin: Secondary | ICD-10-CM | POA: Insufficient documentation

## 2022-07-14 NOTE — Assessment & Plan Note (Signed)
Well controlled.  No changes to medicines. Vascepa 2g twice a day.  Continue to work on eating a healthy diet and exercise.  Labs drawn today.

## 2022-07-14 NOTE — Assessment & Plan Note (Signed)
Check labs 

## 2022-07-14 NOTE — Assessment & Plan Note (Signed)
Previously well controlled Continue Synthroid at current dose  Recheck TSH and adjust Synthroid as indicated   

## 2022-07-14 NOTE — Assessment & Plan Note (Signed)
Hemoglobin A1c 5.5%, 3 month avg of blood sugars, is in prediabetic range.  In order to prevent progression to diabetes, recommend low carb diet and regular exercise  

## 2022-07-14 NOTE — Assessment & Plan Note (Signed)
Well controlled.  No changes to medicines. Taking Amlodipine 5 mg daily, Losartan 100 mg daily  Continue to work on eating a healthy diet and exercise.  Labs drawn today.

## 2022-07-14 NOTE — Assessment & Plan Note (Signed)
The current medical regimen is effective;  continue present plan and medications.  Currently Adderall 30 mg daily.

## 2022-07-16 ENCOUNTER — Encounter: Payer: Self-pay | Admitting: Family Medicine

## 2022-07-16 DIAGNOSIS — G5603 Carpal tunnel syndrome, bilateral upper limbs: Secondary | ICD-10-CM | POA: Insufficient documentation

## 2022-07-16 DIAGNOSIS — Z1211 Encounter for screening for malignant neoplasm of colon: Secondary | ICD-10-CM | POA: Insufficient documentation

## 2022-07-16 NOTE — Assessment & Plan Note (Signed)
Recommend carpal tunnel braces.  Education given.

## 2022-08-09 ENCOUNTER — Other Ambulatory Visit: Payer: Self-pay | Admitting: Family Medicine

## 2022-08-09 DIAGNOSIS — F900 Attention-deficit hyperactivity disorder, predominantly inattentive type: Secondary | ICD-10-CM

## 2022-08-10 MED ORDER — AMPHETAMINE-DEXTROAMPHET ER 30 MG PO CP24
30.0000 mg | ORAL_CAPSULE | Freq: Every day | ORAL | 0 refills | Status: DC
Start: 2022-08-10 — End: 2022-09-13

## 2022-08-25 ENCOUNTER — Other Ambulatory Visit: Payer: Self-pay | Admitting: Family Medicine

## 2022-08-25 DIAGNOSIS — N3941 Urge incontinence: Secondary | ICD-10-CM

## 2022-08-28 ENCOUNTER — Other Ambulatory Visit: Payer: Self-pay

## 2022-08-28 DIAGNOSIS — I10 Essential (primary) hypertension: Secondary | ICD-10-CM

## 2022-08-28 MED ORDER — AMLODIPINE BESYLATE 5 MG PO TABS
5.0000 mg | ORAL_TABLET | Freq: Every day | ORAL | 1 refills | Status: DC
Start: 2022-08-28 — End: 2023-02-19

## 2022-09-13 ENCOUNTER — Other Ambulatory Visit: Payer: Self-pay | Admitting: Family Medicine

## 2022-09-13 DIAGNOSIS — F900 Attention-deficit hyperactivity disorder, predominantly inattentive type: Secondary | ICD-10-CM

## 2022-09-14 MED ORDER — AMPHETAMINE-DEXTROAMPHET ER 30 MG PO CP24
30.0000 mg | ORAL_CAPSULE | Freq: Every day | ORAL | 0 refills | Status: DC
Start: 2022-09-14 — End: 2022-10-12

## 2022-09-15 ENCOUNTER — Other Ambulatory Visit: Payer: Self-pay | Admitting: Family Medicine

## 2022-09-15 DIAGNOSIS — I10 Essential (primary) hypertension: Secondary | ICD-10-CM

## 2022-09-29 ENCOUNTER — Other Ambulatory Visit: Payer: Self-pay | Admitting: Family Medicine

## 2022-10-12 ENCOUNTER — Other Ambulatory Visit: Payer: Self-pay | Admitting: Family Medicine

## 2022-10-12 DIAGNOSIS — F900 Attention-deficit hyperactivity disorder, predominantly inattentive type: Secondary | ICD-10-CM

## 2022-10-13 MED ORDER — AMPHETAMINE-DEXTROAMPHET ER 30 MG PO CP24
30.0000 mg | ORAL_CAPSULE | Freq: Every day | ORAL | 0 refills | Status: DC
Start: 2022-10-13 — End: 2022-11-12

## 2022-11-12 ENCOUNTER — Other Ambulatory Visit: Payer: Self-pay | Admitting: Family Medicine

## 2022-11-12 DIAGNOSIS — F900 Attention-deficit hyperactivity disorder, predominantly inattentive type: Secondary | ICD-10-CM

## 2022-11-13 MED ORDER — AMPHETAMINE-DEXTROAMPHET ER 30 MG PO CP24
30.0000 mg | ORAL_CAPSULE | Freq: Every day | ORAL | 0 refills | Status: DC
Start: 2022-11-13 — End: 2022-12-14

## 2022-12-14 ENCOUNTER — Other Ambulatory Visit: Payer: Self-pay | Admitting: Family Medicine

## 2022-12-14 DIAGNOSIS — F900 Attention-deficit hyperactivity disorder, predominantly inattentive type: Secondary | ICD-10-CM

## 2022-12-14 MED ORDER — AMPHETAMINE-DEXTROAMPHET ER 30 MG PO CP24
30.0000 mg | ORAL_CAPSULE | Freq: Every day | ORAL | 0 refills | Status: DC
Start: 2022-12-14 — End: 2023-01-12

## 2022-12-15 ENCOUNTER — Other Ambulatory Visit: Payer: Self-pay | Admitting: Family Medicine

## 2022-12-15 DIAGNOSIS — I10 Essential (primary) hypertension: Secondary | ICD-10-CM

## 2023-01-12 ENCOUNTER — Other Ambulatory Visit: Payer: Self-pay

## 2023-01-12 DIAGNOSIS — F900 Attention-deficit hyperactivity disorder, predominantly inattentive type: Secondary | ICD-10-CM

## 2023-01-15 MED ORDER — AMPHETAMINE-DEXTROAMPHET ER 30 MG PO CP24
30.0000 mg | ORAL_CAPSULE | Freq: Every day | ORAL | 0 refills | Status: DC
Start: 2023-01-15 — End: 2023-02-11

## 2023-01-16 NOTE — Assessment & Plan Note (Signed)
The current medical regimen is effective;  continue present plan and medications.  Currently Adderall 30 mg daily.

## 2023-01-16 NOTE — Assessment & Plan Note (Signed)
Hemoglobin A1c 5.8%, 3 month avg of blood sugars, is in prediabetic range.  In order to prevent progression to diabetes, recommend low carb diet and regular exercise  

## 2023-01-16 NOTE — Assessment & Plan Note (Signed)
Well controlled.  No changes to medicines. Vascepa 2g twice a day.  Continue to work on eating a healthy diet and exercise.  Labs drawn today.

## 2023-01-16 NOTE — Assessment & Plan Note (Signed)
The current medical regimen is effective;  continue present plan and medications. Continue oxybutynin 15 mg daily.

## 2023-01-16 NOTE — Progress Notes (Unsigned)
Subjective:  Patient ID: Ashley Colon, female    DOB: 1961/01/14  Age: 62 y.o. MRN: [id number]  Chief Complaint  Patient presents with   Annual Exam    HPI Hypertension: Taking Amlodipine 5 mg daily, Losartan 100 mg daily   Urge incontinence: Taking Oxybutynin 15 mg daily. Works Firefighter.    Hypothyroidism: Takes Euthyrox 150 mcg daily.   ADHD: Currently Adderall 30 mg daily. Helps her focus.   Hyperlipidemia: Vascepa 2g twice a day. Eating healthy. Not exercising enough.   Vitamin D deficiency: Taking vitamin D 50K weekly.   C/o being fatigue, sleeps 6-7 hours a night, work shift is 4 am to 1 pm. Will take  a nap through out the day.     01/17/2023    8:10 AM 07/11/2022    7:37 AM 04/04/2022    7:33 AM 12/28/2020    2:13 PM 06/03/2020    8:09 AM  Depression screen PHQ 2/9  Decreased Interest 0 0 0 0 0  Down, Depressed, Hopeless 0 0 0 0 0  PHQ - 2 Score 0 0 0 0 0  Altered sleeping     0  Tired, decreased energy     0  Change in appetite     0  Feeling bad or failure about yourself      0  Trouble concentrating     0  Moving slowly or fidgety/restless     0  Suicidal thoughts     0  PHQ-9 Score     0  Difficult doing work/chores     Not difficult at all        01/17/2023    8:11 AM  Fall Risk   Falls in the past year? 0  Number falls in past yr: 0  Injury with Fall? 0  Risk for fall due to : No Fall Risks  Follow up Falls evaluation completed;Follow up appointment    Patient Care Team: Blane Ohara, MD as PCP - General (Family Medicine)   Review of Systems  Constitutional:  Positive for fatigue. Negative for chills and fever.  HENT:  Negative for congestion, ear pain, rhinorrhea and sore throat.   Respiratory:  Negative for cough and shortness of breath.   Cardiovascular:  Negative for chest pain.  Gastrointestinal:  Negative for abdominal pain, constipation, diarrhea, nausea and vomiting.  Genitourinary:  Negative for dysuria and urgency.   Musculoskeletal:  Positive for myalgias (severe leg cramps and hand cramps). Negative for back pain.  Neurological:  Negative for dizziness, weakness, light-headedness and headaches.  Psychiatric/Behavioral:  Negative for dysphoric mood. The patient is not nervous/anxious.     Current Outpatient Medications on File Prior to Visit  Medication Sig Dispense Refill   amLODipine (NORVASC) 5 MG tablet Take 1 tablet (5 mg total) by mouth daily. 90 tablet 1   amphetamine-dextroamphetamine (ADDERALL XR) 30 MG 24 hr capsule Take 1 capsule (30 mg total) by mouth daily. 30 capsule 0   icosapent Ethyl (VASCEPA) 1 g capsule Take 1 capsule by mouth twice daily 180 capsule 1   levothyroxine (EUTHYROX) 150 MCG tablet Take 1 tablet (150 mcg total) by mouth daily before breakfast. 90 tablet 3   losartan (COZAAR) 100 MG tablet Take 1 tablet by mouth once daily 90 tablet 0   meloxicam (MOBIC) 7.5 MG tablet Take 1 tablet (7.5 mg total) by mouth 2 (two) times daily. 180 tablet 0   Multiple Vitamin (MULTIVITAMIN) tablet Take 1 tablet by mouth 2 (two)  times daily.     oxybutynin (DITROPAN XL) 15 MG 24 hr tablet Take 1 tablet by mouth once daily 90 tablet 3   triamcinolone (KENALOG) 0.025 % ointment Apply 1 application. topically 2 (two) times daily. 30 g 0   Vitamin D, Ergocalciferol, (DRISDOL) 1.25 MG (50000 UNIT) CAPS capsule Take 1 capsule (50,000 Units total) by mouth once a week. 12 capsule 3   No current facility-administered medications on file prior to visit.   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   Overactive bladder 04/28/2019   Past Surgical History:  Procedure Laterality Date   CESAREAN SECTION  1999   CHOLECYSTECTOMY  1990   GASTRIC BYPASS  2008    Family History  Problem Relation Age of Onset   Pulmonary embolism Mother     Arthritis/Rheumatoid Mother    Coronary artery disease Father    Diverticulitis Father    Arthritis Father    Lung cancer Maternal Grandfather    Breast cancer Maternal Aunt    Social History   Socioeconomic History   Marital status: Married    Spouse name: Not on file   Number of children: 1   Years of education: Not on file   Highest education level: Not on file  Occupational History   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, nature  Tobacco Use   Smoking status: Never   Smokeless tobacco: Never  Vaping Use   Vaping status: Never Used  Substance and Sexual Activity   Alcohol use: Yes    Comment: rare   Drug use: Never   Sexual activity: Not Currently  Other Topics Concern   Not on file  Social History Narrative   Not on file   Social Determinants of Health   Financial Resource Strain: Low Risk  (01/17/2023)   Overall Financial Resource Strain (CARDIA)    Difficulty of Paying Living Expenses: Not very hard  Food Insecurity: No Food Insecurity (01/17/2023)   Hunger Vital Sign    Worried About Running Out of Food in the Last Year: Never true    Ran Out of Food in the Last Year: Never true  Transportation Needs: No Transportation Needs (01/17/2023)   PRAPARE - Administrator, Civil Service (Medical): No    Lack of Transportation (Non-Medical): No  Physical Activity: Sufficiently Active (01/17/2023)   Exercise Vital Sign    Days of Exercise per Week: 5 days    Minutes of Exercise per Session: 40 min  Stress: No Stress Concern Present (01/17/2023)   [employer] of Occupational Health - Occupational Stress Questionnaire    Feeling of Stress : Only a little  Social Connections: Moderately Isolated (01/17/2023)   Social Connection and Isolation Panel [NHANES]    Frequency of Communication with Friends and Family: More than three times a week    Frequency of Social Gatherings with Friends and Family: More than three times a week    Attends Religious Services: Never     Database administrator or Organizations: No    Attends Banker Meetings: Never    Marital Status: Married    Objective:  There were no vitals taken for this visit.     07/11/2022    7:36 AM 04/04/2022    7:30 AM 11/30/2021    7:35 AM  BP/Weight  Systolic BP 124 110 110  Diastolic BP 70  70 68  Wt. (Lbs) 197 193.2 199  BMI 36.62 kg/m2 35.91 kg/m2 36.99 kg/m2    Physical Exam Vitals reviewed.  Constitutional:      General: She is not in acute distress.    Appearance: Normal appearance. She is obese.  HENT:     Right Ear: Tympanic membrane and ear canal normal.     Left Ear: Tympanic membrane and ear canal normal.     Nose: Nose normal. No congestion or rhinorrhea.  Eyes:     Conjunctiva/sclera: Conjunctivae normal.  Neck:     Thyroid: No thyroid mass.     Vascular: No carotid bruit.  Cardiovascular:     Rate and Rhythm: Normal rate and regular rhythm.     Pulses: Normal pulses.     Heart sounds: Normal heart sounds. No murmur heard. Pulmonary:     Effort: Pulmonary effort is normal.     Breath sounds: Normal breath sounds.  Abdominal:     General: Bowel sounds are normal.     Palpations: Abdomen is soft. There is no mass.     Tenderness: There is no abdominal tenderness.  Musculoskeletal:        General: Normal range of motion.  Lymphadenopathy:     Cervical: No cervical adenopathy.  Skin:    General: Skin is warm and dry.  Neurological:     Mental Status: She is alert and oriented to person, place, and time.     Cranial Nerves: No cranial nerve deficit.  Psychiatric:        Mood and Affect: Mood normal.        Behavior: Behavior normal.     Diabetic Foot Exam - Simple   No data filed      Lab Results  Component Value Date   WBC 7.6 01/17/2023   HGB 10.1 (L) 01/17/2023   HCT 35.0 01/17/2023   PLT 345 01/17/2023   GLUCOSE 101 (H) 01/17/2023   CHOL 160 01/17/2023   TRIG 141 01/17/2023   HDL 34 (L) 01/17/2023   LDLCALC 101 (H) 01/17/2023    ALT 19 01/17/2023   AST 25 01/17/2023   NA 139 01/17/2023   K 4.3 01/17/2023   CL 102 01/17/2023   CREATININE 0.70 01/17/2023   BUN 13 01/17/2023   CO2 22 01/17/2023   TSH 3.570 01/17/2023   HGBA1C 6.1 (H) 01/17/2023      Assessment & Plan:    Encounter for routine adult physical exam with abnormal findings Assessment & Plan: Things to do to keep yourself healthy  - Exercise at least 30-45 minutes a day, 3-4 days a week.  - Eat a low-fat diet with lots of fruits and vegetables, up to 7-9 servings per day.  - Seatbelts can save your life. Wear them always.  - Smoke detectors on every level of your home, check batteries every year.  - Eye Doctor - have an eye exam every 1-2 years  - Alcohol -  If you drink, do it moderately, less than 2 drinks per day.  - Health Care Power of Attorney. Choose someone to speak for you if you are not able.  - Depression is common in our stressful world.If you're feeling down or losing interest in things you normally enjoy, please come in for a visit.  - Violence - If anyone is threatening or hurting you, please call immediately.  Orders: -     CBC with Differential/Platelet -     Comprehensive metabolic panel -  Lipid panel -     T4, free -     TSH  Essential hypertension Assessment & Plan: Well controlled.  No changes to medicines. Taking Amlodipine 5 mg daily, Losartan 100 mg daily  Continue to work on eating a healthy diet and exercise.  Labs drawn today.     Hypothyroidism due to acquired atrophy of thyroid Assessment & Plan: Previously well controlled Continue Synthroid at current dose  Recheck TSH and adjust Synthroid as indicated     Attention deficit hyperactivity disorder (ADHD), predominantly inattentive type Assessment & Plan: The current medical regimen is effective;  continue present plan and medications.  Currently Adderall 30 mg daily.    Mixed hyperlipidemia Assessment & Plan: Well controlled.  No changes to  medicines. Vascepa 2g twice a day.  Continue to work on eating a healthy diet and exercise.  Labs drawn today.     Urge incontinence Assessment & Plan: The current medical regimen is effective;  continue present plan and medications. Continue oxybutynin 15 mg daily.    Impaired fasting glucose Assessment & Plan: Hemoglobin A1c 5.8%, 3 month avg of blood sugars, is in prediabetic range.  In order to prevent progression to diabetes, recommend low carb diet and regular exercise   Orders: -     Hemoglobin A1c  Muscle cramps Assessment & Plan: Check labs  Orders: -     Phosphorus -     Magnesium -     CK  Colon cancer screening -     Cologuard     No orders of the defined types were placed in this encounter.   Orders Placed This Encounter  Procedures   CBC with Differential/Platelet   Comprehensive metabolic panel   Hemoglobin A1c   Lipid panel   Phosphorus   Magnesium   CK   T4, free   TSH   Cologuard     Follow-up: Return in about 6 months (around 07/18/2023) for chronic follow up.   I,Marla I Leal-Borjas,acting as a scribe for Blane Ohara, MD.,have documented all relevant documentation on the behalf of Blane Ohara, MD,as directed by  Blane Ohara, MD while in the presence of Blane Ohara, MD.   An After Visit Summary was printed and given to the patient.  Blane Ohara, MD [facility] [phone]

## 2023-01-16 NOTE — Assessment & Plan Note (Signed)
Well controlled.  No changes to medicines. Taking Amlodipine 5 mg daily, Losartan 100 mg daily  Continue to work on eating a healthy diet and exercise.  Labs drawn today.

## 2023-01-16 NOTE — Assessment & Plan Note (Signed)
Previously well controlled Continue Synthroid at current dose  Recheck TSH and adjust Synthroid as indicated   

## 2023-01-17 ENCOUNTER — Encounter: Payer: Self-pay | Admitting: Family Medicine

## 2023-01-17 ENCOUNTER — Ambulatory Visit (INDEPENDENT_AMBULATORY_CARE_PROVIDER_SITE_OTHER): Payer: BC Managed Care – PPO | Admitting: Family Medicine

## 2023-01-17 VITALS — BP 124/80 | HR 67 | Temp 98.7°F | Resp 14 | Ht 61.5 in | Wt 197.0 lb

## 2023-01-17 DIAGNOSIS — F900 Attention-deficit hyperactivity disorder, predominantly inattentive type: Secondary | ICD-10-CM

## 2023-01-17 DIAGNOSIS — Z0001 Encounter for general adult medical examination with abnormal findings: Secondary | ICD-10-CM

## 2023-01-17 DIAGNOSIS — E034 Atrophy of thyroid (acquired): Secondary | ICD-10-CM | POA: Diagnosis not present

## 2023-01-17 DIAGNOSIS — I10 Essential (primary) hypertension: Secondary | ICD-10-CM

## 2023-01-17 DIAGNOSIS — R7301 Impaired fasting glucose: Secondary | ICD-10-CM

## 2023-01-17 DIAGNOSIS — Z1211 Encounter for screening for malignant neoplasm of colon: Secondary | ICD-10-CM

## 2023-01-17 DIAGNOSIS — R252 Cramp and spasm: Secondary | ICD-10-CM | POA: Diagnosis not present

## 2023-01-17 DIAGNOSIS — E782 Mixed hyperlipidemia: Secondary | ICD-10-CM

## 2023-01-17 DIAGNOSIS — N3941 Urge incontinence: Secondary | ICD-10-CM

## 2023-01-17 NOTE — Patient Instructions (Signed)
Things to do to keep yourself healthy  - Exercise at least 30-45 minutes a day, 3-4 days a week.  - Eat a low-fat diet with lots of fruits and vegetables, up to 7-9 servings per day.  - Seatbelts can save your life. Wear them always.  - Smoke detectors on every level of your home, check batteries every year.  - Eye Doctor - have an eye exam every 1-2 years   - Alcohol -  If you drink, do it moderately, less than 2 drinks per day.  - [place]. Choose someone to speak for you if you are not able.  - Depression is common in our stressful world.If you're feeling down or losing interest in things you normally enjoy, please come in for a visit.  - Violence - If anyone is threatening or hurting you, please call immediately.

## 2023-01-18 LAB — COMPREHENSIVE METABOLIC PANEL
ALT: 19 [IU]/L (ref 0–32)
AST: 25 [IU]/L (ref 0–40)
Albumin: 4.3 g/dL (ref 3.8–4.9)
Alkaline Phosphatase: 89 [IU]/L (ref 44–121)
BUN/Creatinine Ratio: 19 (ref 9–23)
BUN: 13 mg/dL (ref 6–24)
Bilirubin Total: 0.5 mg/dL (ref 0.0–1.2)
CO2: 22 mmol/L (ref 20–29)
Calcium: 9 mg/dL (ref 8.7–10.2)
Chloride: 102 mmol/L (ref 96–106)
Creatinine, Ser: 0.7 mg/dL (ref 0.57–1.00)
Globulin, Total: 2.4 g/dL (ref 1.5–4.5)
Glucose: 101 mg/dL — ABNORMAL HIGH (ref 70–99)
Potassium: 4.3 mmol/L (ref 3.5–5.2)
Sodium: 139 mmol/L (ref 134–144)
Total Protein: 6.7 g/dL (ref 6.0–8.5)
eGFR: 100 mL/min/{1.73_m2} (ref >59)

## 2023-01-18 LAB — MAGNESIUM: Magnesium: 2.4 mg/dL — ABNORMAL HIGH (ref 1.6–2.3)

## 2023-01-18 LAB — LIPID PANEL
Chol/HDL Ratio: 4.7 ratio — ABNORMAL HIGH (ref 0.0–4.4)
Cholesterol, Total: 160 mg/dL (ref 100–199)
HDL: 34 mg/dL — ABNORMAL LOW (ref >39)
LDL Chol Calc (NIH): 101 mg/dL — ABNORMAL HIGH (ref 0–99)
Triglycerides: 141 mg/dL (ref 0–149)
VLDL Cholesterol Cal: 25 mg/dL (ref 5–40)

## 2023-01-18 LAB — CBC WITH DIFFERENTIAL/PLATELET
Basophils Absolute: 0.1 10*3/uL (ref 0.0–0.2)
Basos: 1 %
EOS (ABSOLUTE): 0.4 10*3/uL (ref 0.0–0.4)
Eos: 5 %
Hematocrit: 35 % (ref 34.0–46.6)
Hemoglobin: 10.1 g/dL — ABNORMAL LOW (ref 11.1–15.9)
Immature Grans (Abs): 0 10*3/uL (ref 0.0–0.1)
Immature Granulocytes: 0 %
Lymphocytes Absolute: 2.6 10*3/uL (ref 0.7–3.1)
Lymphs: 34 %
MCH: 23.3 pg — ABNORMAL LOW (ref 26.6–33.0)
MCHC: 28.9 g/dL — ABNORMAL LOW (ref 31.5–35.7)
MCV: 81 fL (ref 79–97)
Monocytes Absolute: 0.6 10*3/uL (ref 0.1–0.9)
Monocytes: 8 %
Neutrophils Absolute: 3.9 10*3/uL (ref 1.4–7.0)
Neutrophils: 52 %
Platelets: 345 10*3/uL (ref 150–450)
RBC: 4.34 x10E6/uL (ref 3.77–5.28)
RDW: 15.3 % (ref 11.7–15.4)
WBC: 7.6 10*3/uL (ref 3.4–10.8)

## 2023-01-18 LAB — T4, FREE: Free T4: 1.04 ng/dL (ref 0.82–1.77)

## 2023-01-18 LAB — HEMOGLOBIN A1C
Est. average glucose Bld gHb Est-mCnc: 128 mg/dL
Hgb A1c MFr Bld: 6.1 % — ABNORMAL HIGH (ref 4.8–5.6)

## 2023-01-18 LAB — TSH: TSH: 3.57 u[IU]/mL (ref 0.450–4.500)

## 2023-01-18 LAB — CK: Total CK: 88 U/L (ref 32–182)

## 2023-01-18 LAB — PHOSPHORUS: Phosphorus: 4 mg/dL (ref 3.0–4.3)

## 2023-01-20 DIAGNOSIS — R252 Cramp and spasm: Secondary | ICD-10-CM | POA: Insufficient documentation

## 2023-01-20 DIAGNOSIS — Z0001 Encounter for general adult medical examination with abnormal findings: Secondary | ICD-10-CM | POA: Insufficient documentation

## 2023-01-20 NOTE — Assessment & Plan Note (Signed)
Check labs 

## 2023-01-20 NOTE — Assessment & Plan Note (Signed)
Things to do to keep yourself healthy  - Exercise at least 30-45 minutes a day, 3-4 days a week.  - Eat a low-fat diet with lots of fruits and vegetables, up to 7-9 servings per day.  - Seatbelts can save your life. Wear them always.  - Smoke detectors on every level of your home, check batteries every year.  - Eye Doctor - have an eye exam every 1-2 years   - Alcohol -  If you drink, do it moderately, less than 2 drinks per day.  - [place]. Choose someone to speak for you if you are not able.  - Depression is common in our stressful world.If you're feeling down or losing interest in things you normally enjoy, please come in for a visit.  - Violence - If anyone is threatening or hurting you, please call immediately.

## 2023-01-23 ENCOUNTER — Encounter: Payer: Self-pay | Admitting: Family Medicine

## 2023-01-23 LAB — IRON,TIBC AND FERRITIN PANEL
Ferritin: 8 ng/mL — ABNORMAL LOW (ref 15–150)
Iron Saturation: 12 % — ABNORMAL LOW (ref 15–55)
Iron: 50 ug/dL (ref 27–159)
Total Iron Binding Capacity: 416 ug/dL (ref 250–450)
UIBC: 366 ug/dL (ref 131–425)

## 2023-01-23 LAB — SPECIMEN STATUS REPORT

## 2023-01-24 ENCOUNTER — Encounter: Payer: Self-pay | Admitting: Family Medicine

## 2023-02-11 ENCOUNTER — Other Ambulatory Visit: Payer: Self-pay | Admitting: Family Medicine

## 2023-02-11 DIAGNOSIS — F900 Attention-deficit hyperactivity disorder, predominantly inattentive type: Secondary | ICD-10-CM

## 2023-02-12 MED ORDER — AMPHETAMINE-DEXTROAMPHET ER 30 MG PO CP24
30.0000 mg | ORAL_CAPSULE | Freq: Every day | ORAL | 0 refills | Status: DC
Start: 2023-02-12 — End: 2023-03-15

## 2023-02-19 ENCOUNTER — Other Ambulatory Visit: Payer: Self-pay | Admitting: Family Medicine

## 2023-02-19 DIAGNOSIS — I10 Essential (primary) hypertension: Secondary | ICD-10-CM

## 2023-03-12 ENCOUNTER — Other Ambulatory Visit: Payer: Self-pay | Admitting: Family Medicine

## 2023-03-12 DIAGNOSIS — I10 Essential (primary) hypertension: Secondary | ICD-10-CM

## 2023-03-15 ENCOUNTER — Other Ambulatory Visit: Payer: Self-pay

## 2023-03-15 DIAGNOSIS — F900 Attention-deficit hyperactivity disorder, predominantly inattentive type: Secondary | ICD-10-CM

## 2023-03-16 MED ORDER — AMPHETAMINE-DEXTROAMPHET ER 30 MG PO CP24
30.0000 mg | ORAL_CAPSULE | Freq: Every day | ORAL | 0 refills | Status: DC
Start: 2023-03-16 — End: 2023-04-19

## 2023-03-27 ENCOUNTER — Other Ambulatory Visit: Payer: Self-pay | Admitting: Family Medicine

## 2023-03-31 ENCOUNTER — Other Ambulatory Visit: Payer: Self-pay | Admitting: Family Medicine

## 2023-03-31 DIAGNOSIS — E559 Vitamin D deficiency, unspecified: Secondary | ICD-10-CM

## 2023-04-17 ENCOUNTER — Ambulatory Visit: Payer: BC Managed Care – PPO

## 2023-04-17 VITALS — BP 132/90 | HR 96 | Temp 97.7°F | Ht 61.5 in | Wt 204.0 lb

## 2023-04-17 DIAGNOSIS — Z9884 Bariatric surgery status: Secondary | ICD-10-CM

## 2023-04-17 DIAGNOSIS — R4781 Slurred speech: Secondary | ICD-10-CM

## 2023-04-17 DIAGNOSIS — I10 Essential (primary) hypertension: Secondary | ICD-10-CM

## 2023-04-17 DIAGNOSIS — E782 Mixed hyperlipidemia: Secondary | ICD-10-CM | POA: Diagnosis not present

## 2023-04-17 NOTE — Assessment & Plan Note (Signed)
Acute onset of slurred speech since yesterday morning without associated symptoms such as vision problems, severe headaches, hearing issues, drooling, trouble chewing or swallowing, chest pain, weakness, tingling, or numbness.   Physical exam shows no major neurological deficits, ruling out a major stroke or cerebellar stroke. I DID NOT NOTICE ANY SLURRING OF SPEECH OR WORD FINDING DIFFICULTY EITHER.   Differential diagnosis includes minor stroke, TIA, or vitamin deficiency.   MRI of the brain is necessary if symptoms worsen to rule out a small stroke. Immediate emergency care is required if new symptoms occur. If blood work is normal, symptoms may resolve on their own, but a neurologist consultation may be needed if symptoms persist. - Order CBC with differential, B12, folic acid level, iron panel, TSH, A1c, sed rate, and CRP - Advise to monitor for any new or worsening symptoms and call 911 if she occurs - Encourage reading out loud and adequate hydration - Follow up with primary care physician in a few weeks or sooner if symptoms persist - Consider referral to a neurologist if symptoms do not improve

## 2023-04-17 NOTE — Patient Instructions (Signed)
VISIT SUMMARY:  During today's visit, we discussed your recent onset of slurred speech, which began suddenly yesterday morning. We also reviewed your history of hypertension, hypothyroidism, attention deficit disorder, and past gastric bypass surgery. Your blood pressure was slightly elevated, and you reported experiencing restless legs at night and recent discomfort in your arms and legs.  YOUR PLAN:  -SLURRED SPEECH: Slurred speech can be a sign of various conditions, including minor stroke, transient ischemic attack (TIA), or vitamin deficiency. We will conduct blood tests to check for any deficiencies and monitor your symptoms closely. If your symptoms worsen, an MRI of the brain may be necessary. Please monitor for any new or worsening symptoms and call 911 if they occur. Reading out loud and staying hydrated may help. Follow up with your primary care physician in a few weeks, or sooner if symptoms persist. A referral to a neurologist may be considered if there is no improvement.  -HYPERTENSION: Hypertension, or high blood pressure, was slightly elevated today at 132/90 mmHg. This could be due to stress. Please monitor your blood pressure at home regularly.  -ANEMIA: Anemia, a condition where you lack enough healthy red blood cells, is likely due to malabsorption from your gastric bypass surgery. We will repeat the iron panel as part of your blood work to check your current levels.  -HYPOTHYROIDISM: Hypothyroidism is a condition where your thyroid gland doesn't produce enough thyroid hormone. No specific issues were discussed today, but we will continue to monitor your thyroid function as part of your routine care.  -RESTLESS LEGS SYNDROME: Restless Legs Syndrome is a condition characterized by an uncontrollable urge to move your legs, usually due to discomfort. You reported experiencing this at night along with recent pain in your arms and legs. We will monitor these symptoms and consider further  evaluation if they persist.  -GENERAL HEALTH MAINTENANCE: Your last blood work in October showed good kidney and liver function, low HDL, and high B12 levels. Vitamin D levels were good. We will order repeat blood work to check the current status of your electrolytes, kidney function, liver function, cholesterol, and vitamin levels.  INSTRUCTIONS:  Please follow up with your primary care physician in a few weeks. Call the clinic if your symptoms worsen or do not improve. If necessary, we may refer you to a neurologist for further evaluation.

## 2023-04-17 NOTE — Assessment & Plan Note (Addendum)
Blood pressure today is 132/90 mmHg, slightly elevated. Attributes this to being anxious about her symptoms.  Patient reports not taking blood pressure at home recently and is currently stressed. - Advise home blood pressure monitoring Call 911 if severely elevated

## 2023-04-17 NOTE — Assessment & Plan Note (Signed)
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 Previous labs showed low ferritin levels. - Repeat iron panel as part of blood work  Hypothyroidism No specific issues discussed during this visit. Currently on 150 mcg levothyroxine - Monitor thyroid function as part of routine care  Restless Legs Syndrome Reports restless legs at night and recent pain in arms and legs. No clear connection to current symptoms. - Monitor symptoms and consider further evaluation if they persist  General Health Maintenance Last blood work in October showed good kidney and liver function, low HDL, and high B12. Vitamin D levels were good. - Order repeat blood work to check current status of electrolytes, kidney function, liver function, cholesterol, and vitamin levels  Follow-up - Follow up with primary care physician in a few weeks - Call the clinic if symptoms worsen or do not improve - Consider referral to a neurologist if necessary.

## 2023-04-17 NOTE — Progress Notes (Signed)
Acute Office Visit  Subjective:    Patient ID: Ashley Colon, female    DOB: 1960/10/30, 63 y.o.   MRN: [id number]  No chief complaint on file.   Discussed the use of AI scribe software for clinical note transcription with the patient, who gave verbal consent to proceed.      HPI: Patient is in today for concerns of speech. Patient states all started yesterday morning. Patient states if she does not focus on what she is trying to say, her speech is off. The patient, known to have hypertension, hypothyroidism, and attention deficit disorder, presented with a sudden onset of slurred speech. She reported waking up the previous day and noticing that her speech was not as clear as usual. The patient described her speech as slurred unless she spoke slowly and focused on each word. She denied any other associated symptoms such as difficulty closing the mouth, choking, vision problems, severe headaches, hearing issues, drooling, trouble chewing or swallowing, chest pain, weakness in hands or legs, tingling or numbness in hands or legs. However, she did mention experiencing restless legs at night and recent discomfort in both arms and legs. The patient denied any changes in medication intake and reported adherence to her current regimen. She also has a history of gastric bypass surgery approximately 18 years ago. The patient's blood pressure was noted to be slightly elevated during the consultation, which she attributed to her current anxiety.  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   Overactive bladder 04/28/2019    Past Surgical History:  Procedure Laterality Date   CESAREAN SECTION  1999   CHOLECYSTECTOMY  1990   GASTRIC BYPASS  2008    Family History  Problem Relation Age of Onset   Pulmonary embolism Mother     Arthritis/Rheumatoid Mother    Coronary artery disease Father    Diverticulitis Father    Arthritis Father    Lung cancer Maternal Grandfather    Breast cancer Maternal Aunt     Social History   Socioeconomic History   Marital status: Married    Spouse name: Not on file   Number of children: 1   Years of education: Not on file   Highest education level: Not on file  Occupational History   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, nature  Tobacco Use   Smoking status: Never   Smokeless tobacco: Never  Vaping Use   Vaping status: Never Used  Substance and Sexual Activity   Alcohol use: Yes    Comment: rare   Drug use: Never   Sexual activity: Not Currently  Other Topics Concern   Not on file  Social History Narrative   Not on file   Social Drivers of Health   Financial Resource Strain: Low Risk  (01/17/2023)   Overall Financial Resource Strain (CARDIA)    Difficulty of Paying Living Expenses: Not very hard  Food Insecurity: No Food Insecurity (01/17/2023)   Hunger Vital Sign    Worried About Running Out of Food in the Last Year: Never true    Ran Out of Food in the Last Year: Never true  Transportation Needs: No Transportation Needs (01/17/2023)   PRAPARE - Administrator, Civil Service (Medical): No    Lack of Transportation (Non-Medical): No  Physical Activity: Sufficiently Active (01/17/2023)   Exercise Vital Sign    Days of Exercise  per Week: 5 days    Minutes of Exercise per Session: 40 min  Stress: No Stress Concern Present (01/17/2023)   [employer] of Occupational Health - Occupational Stress Questionnaire    Feeling of Stress : Only a little  Social Connections: Moderately Isolated (01/17/2023)   Social Connection and Isolation Panel [NHANES]    Frequency of Communication with Friends and Family: More than three times a week    Frequency of Social Gatherings with Friends and Family: More than three times a week    Attends Religious Services: Never     Database administrator or Organizations: No    Attends Banker Meetings: Never    Marital Status: Married  Catering manager Violence: Not At Risk (01/17/2023)   Humiliation, Afraid, Rape, and Kick questionnaire    Fear of Current or Ex-Partner: No    Emotionally Abused: No    Physically Abused: No    Sexually Abused: No    Outpatient Medications Prior to Visit  Medication Sig Dispense Refill   amLODipine (NORVASC) 5 MG tablet Take 1 tablet by mouth once daily 90 tablet 0   amphetamine-dextroamphetamine (ADDERALL XR) 30 MG 24 hr capsule Take 1 capsule (30 mg total) by mouth daily. 30 capsule 0   icosapent Ethyl (VASCEPA) 1 g capsule Take 1 capsule by mouth twice daily 180 capsule 0   levothyroxine (EUTHYROX) 150 MCG tablet Take 1 tablet (150 mcg total) by mouth daily before breakfast. 90 tablet 3   losartan (COZAAR) 100 MG tablet Take 1 tablet by mouth once daily 90 tablet 1   meloxicam (MOBIC) 7.5 MG tablet Take 1 tablet (7.5 mg total) by mouth 2 (two) times daily. 180 tablet 0   Multiple Vitamin (MULTIVITAMIN) tablet Take 1 tablet by mouth 2 (two) times daily.     oxybutynin (DITROPAN XL) 15 MG 24 hr tablet Take 1 tablet by mouth once daily 90 tablet 3   triamcinolone (KENALOG) 0.025 % ointment Apply 1 application. topically 2 (two) times daily. 30 g 0   Vitamin D, Ergocalciferol, (DRISDOL) 1.25 MG (50000 UNIT) CAPS capsule Take 1 capsule by mouth once a week 12 capsule 0   No facility-administered medications prior to visit.    Allergies  Allergen Reactions   Oxycodone-Acetaminophen Rash and Itching    Review of Systems  Constitutional:  Negative for chills, fatigue and fever.  HENT:  Negative for congestion, ear pain, sinus pressure and sore throat.   Respiratory:  Negative for cough and shortness of breath.   Cardiovascular:  Negative for chest pain.  Gastrointestinal:  Negative for abdominal pain, constipation, diarrhea, nausea and vomiting.  Genitourinary:   Negative for dysuria and frequency.  Musculoskeletal:  Negative for arthralgias, back pain and myalgias.  Neurological:  Positive for speech difficulty. Negative for dizziness and headaches.  Psychiatric/Behavioral:  Negative for dysphoric mood. The patient is not nervous/anxious.        Objective:        04/17/2023   10:28 AM 01/21/2023    1:21 PM 07/11/2022    7:36 AM  Vitals with BMI  Height 5' 1.5" 5' 1.5" 5' 1.5"  Weight 204 lbs 197 lbs 197 lbs  BMI 37.93 36.62 36.62  Systolic 132 124 161  Diastolic 90 80 70  Pulse 96 67 68    No data found.   Physical Exam Vitals and nursing note reviewed.  Constitutional:      Comments: VITALS: BP- 132/90 HEENT: Pupils equal, reactive to light,  extraocular movements intact, visual fields full by confrontation. Pharynx normal, no exudate, uvula midline, tonsils not enlarged. Oral mucosa moist without lesions, dentures well-fitting. No facial asymmetry. Able to open mouth wide, close eyes tightly, puff cheeks, frown, and clench jaws. CARDIOVASCULAR: Heart sounds regular without murmurs, rubs, or gallops. NEUROLOGICAL: Cranial nerves grossly intact based on examination of eye movements, facial expressions, and shoulder shrug. Upper and lower extremity strength 5/5 bilaterally. Reflexes normal in upper and lower extremities. Sensation intact to light touch bilaterally in upper and lower extremities. Finger-to-nose and heel-to-shin tests demonstrate normal coordination. Gait normal, able to walk heel-to-toe without difficulty. Romberg test negative. Rapid alternating movements performed without difficulty.     Health Maintenance Due  Topic Date Due   Fecal DNA (Cologuard)  07/07/2022    There are no preventive care reminders to display for this patient.   Lab Results  Component Value Date   TSH 3.570 01/17/2023   Lab Results  Component Value Date   WBC 7.6 01/17/2023   HGB 10.1 (L) 01/17/2023   HCT 35.0 01/17/2023   MCV 81  01/17/2023   PLT 345 01/17/2023   Lab Results  Component Value Date   NA 139 01/17/2023   K 4.3 01/17/2023   CO2 22 01/17/2023   GLUCOSE 101 (H) 01/17/2023   BUN 13 01/17/2023   CREATININE 0.70 01/17/2023   BILITOT 0.5 01/17/2023   ALKPHOS 89 01/17/2023   AST 25 01/17/2023   ALT 19 01/17/2023   PROT 6.7 01/17/2023   ALBUMIN 4.3 01/17/2023   CALCIUM 9.0 01/17/2023   EGFR 100 01/17/2023   Lab Results  Component Value Date   CHOL 160 01/17/2023   Lab Results  Component Value Date   HDL 34 (L) 01/17/2023   Lab Results  Component Value Date   LDLCALC 101 (H) 01/17/2023   Lab Results  Component Value Date   TRIG 141 01/17/2023   Lab Results  Component Value Date   CHOLHDL 4.7 (H) 01/17/2023   Lab Results  Component Value Date   HGBA1C 6.1 (H) 01/17/2023       Assessment & Plan:  Slurred speech Assessment & Plan: Acute onset of slurred speech since yesterday morning without associated symptoms such as vision problems, severe headaches, hearing issues, drooling, trouble chewing or swallowing, chest pain, weakness, tingling, or numbness.   Physical exam shows no major neurological deficits, ruling out a major stroke or cerebellar stroke. I DID NOT NOTICE ANY SLURRING OF SPEECH OR WORD FINDING DIFFICULTY EITHER.   Differential diagnosis includes minor stroke, TIA, or vitamin deficiency.   MRI of the brain is necessary if symptoms worsen to rule out a small stroke. Immediate emergency care is required if new symptoms occur. If blood work is normal, symptoms may resolve on their own, but a neurologist consultation may be needed if symptoms persist. - Order CBC with differential, B12, folic acid level, iron panel, TSH, A1c, sed rate, and CRP - Advise to monitor for any new or worsening symptoms and call 911 if she occurs - Encourage reading out loud and adequate hydration - Follow up with primary care physician in a few weeks or sooner if symptoms persist - Consider  referral to a neurologist if symptoms do not improve  Orders: -     CBC with Differential/Platelet -     B12 and Folate Panel -     TSH -     Hemoglobin A1c -     Iron, TIBC and Ferritin Panel -  Sedimentation rate -     C-reactive protein  Mixed hyperlipidemia Assessment & Plan: Slightly low HDL Not a diabetic. Not on a statin. Will consider repeating lipid panel at a later time when fasting and consider statin due to stroke risk  Orders: -     CBC with Differential/Platelet -     B12 and Folate Panel -     TSH -     Hemoglobin A1c -     Iron, TIBC and Ferritin Panel -     Sedimentation rate -     C-reactive protein  Essential hypertension Assessment & Plan: Blood pressure today is 132/90 mmHg, slightly elevated. Attributes this to being anxious about her symptoms.  Patient reports not taking blood pressure at home recently and is currently stressed. 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 Previous labs showed low ferritin levels. - Repeat iron panel as part of blood work  Hypothyroidism No specific issues discussed during this visit. Currently on 150 mcg levothyroxine - Monitor thyroid function as part of routine care  Restless Legs Syndrome Reports restless legs at night and recent pain in arms and legs. No clear connection to current symptoms. - Monitor symptoms and consider further evaluation if they persist  General Health Maintenance Last blood work in October showed good kidney and liver function, low HDL, and high B12. Vitamin D levels were good. - Order repeat blood work to check current status of  electrolytes, kidney function, liver function, cholesterol, and vitamin levels  Follow-up - Follow up with primary care physician in a few weeks - Call the clinic if symptoms worsen or do not improve - Consider referral to a neurologist if necessary.  Orders: -     CBC with Differential/Platelet -     B12 and Folate Panel -     TSH -     Hemoglobin A1c -     Iron, TIBC and Ferritin Panel -     Sedimentation rate -     C-reactive protein     No orders of the defined types were placed in this encounter.   Orders Placed This Encounter  Procedures   CBC with Differential/Platelet   B12 and Folate Panel   TSH   Hemoglobin A1c   Iron, TIBC and Ferritin Panel   Sedimentation Rate   C-reactive protein     Follow-up: Return in about 4 weeks (around 05/15/2023).  An After Visit Summary was printed and given to the patient.  Windell Moment, MD [facility] [phone]

## 2023-04-17 NOTE — Assessment & Plan Note (Signed)
Slightly low HDL Not a diabetic. Not on a statin. Will consider repeating lipid panel at a later time when fasting and consider statin due to stroke risk

## 2023-04-18 LAB — CBC WITH DIFFERENTIAL/PLATELET
Basophils Absolute: 0.1 10*3/uL (ref 0.0–0.2)
Basos: 1 %
EOS (ABSOLUTE): 0.3 10*3/uL (ref 0.0–0.4)
Eos: 4 %
Hematocrit: 40.8 % (ref 34.0–46.6)
Hemoglobin: 12.8 g/dL (ref 11.1–15.9)
Immature Grans (Abs): 0 10*3/uL (ref 0.0–0.1)
Immature Granulocytes: 0 %
Lymphocytes Absolute: 2.6 10*3/uL (ref 0.7–3.1)
Lymphs: 34 %
MCH: 25 pg — ABNORMAL LOW (ref 26.6–33.0)
MCHC: 31.4 g/dL — ABNORMAL LOW (ref 31.5–35.7)
MCV: 80 fL (ref 79–97)
Monocytes Absolute: 0.5 10*3/uL (ref 0.1–0.9)
Monocytes: 7 %
Neutrophils Absolute: 4.1 10*3/uL (ref 1.4–7.0)
Neutrophils: 54 %
Platelets: 340 10*3/uL (ref 150–450)
RBC: 5.12 x10E6/uL (ref 3.77–5.28)
RDW: 16.8 % — ABNORMAL HIGH (ref 11.7–15.4)
WBC: 7.5 10*3/uL (ref 3.4–10.8)

## 2023-04-18 LAB — B12 AND FOLATE PANEL
Folate: >20 ng/mL (ref >3.0)
Vitamin B-12: >2000 pg/mL — ABNORMAL HIGH (ref 232–1245)

## 2023-04-18 LAB — IRON,TIBC AND FERRITIN PANEL
Ferritin: 15 ng/mL (ref 15–150)
Iron Saturation: 12 % — ABNORMAL LOW (ref 15–55)
Iron: 54 ug/dL (ref 27–159)
Total Iron Binding Capacity: 435 ug/dL (ref 250–450)
UIBC: 381 ug/dL (ref 131–425)

## 2023-04-18 LAB — SEDIMENTATION RATE: Sed Rate: 15 mm/h (ref 0–40)

## 2023-04-18 LAB — HEMOGLOBIN A1C
Est. average glucose Bld gHb Est-mCnc: 126 mg/dL
Hgb A1c MFr Bld: 6 % — ABNORMAL HIGH (ref 4.8–5.6)

## 2023-04-18 LAB — C-REACTIVE PROTEIN: CRP: 4 mg/L (ref 0–10)

## 2023-04-18 LAB — TSH: TSH: 4.26 u[IU]/mL (ref 0.450–4.500)

## 2023-04-19 ENCOUNTER — Other Ambulatory Visit: Payer: Self-pay | Admitting: Family Medicine

## 2023-04-19 ENCOUNTER — Other Ambulatory Visit: Payer: Self-pay

## 2023-04-19 DIAGNOSIS — M791 Myalgia, unspecified site: Secondary | ICD-10-CM

## 2023-04-19 DIAGNOSIS — F900 Attention-deficit hyperactivity disorder, predominantly inattentive type: Secondary | ICD-10-CM

## 2023-04-20 MED ORDER — AMPHETAMINE-DEXTROAMPHET ER 30 MG PO CP24: 30.0000 mg | ORAL_CAPSULE | Freq: Every day | ORAL | 0 refills | Status: DC

## 2023-04-22 DIAGNOSIS — I6389 Other cerebral infarction: Secondary | ICD-10-CM | POA: Diagnosis not present

## 2023-04-25 ENCOUNTER — Encounter: Payer: Self-pay | Admitting: Family Medicine

## 2023-04-25 ENCOUNTER — Ambulatory Visit: Payer: BC Managed Care – PPO | Attending: Family Medicine

## 2023-04-25 ENCOUNTER — Ambulatory Visit: Payer: BC Managed Care – PPO | Admitting: Family Medicine

## 2023-04-25 VITALS — BP 150/82 | HR 82 | Temp 97.6°F | Ht 61.5 in | Wt 203.0 lb

## 2023-04-25 DIAGNOSIS — I10 Essential (primary) hypertension: Secondary | ICD-10-CM

## 2023-04-25 DIAGNOSIS — I69351 Hemiplegia and hemiparesis following cerebral infarction affecting right dominant side: Secondary | ICD-10-CM

## 2023-04-25 DIAGNOSIS — R4701 Aphasia: Secondary | ICD-10-CM | POA: Diagnosis not present

## 2023-04-25 MED ORDER — AMLODIPINE BESYLATE 10 MG PO TABS: 10.0000 mg | ORAL_TABLET | Freq: Every day | ORAL | 0 refills | Status: DC

## 2023-04-25 NOTE — Progress Notes (Signed)
Subjective:  Patient ID: Ashley Colon, female    DOB: [birth year]  Age: 63 y.o. MRN: [id number]  Chief Complaint  Patient presents with   Hospitalization Follow-up    HPI Patient presents today for hospital follow up.  The patient, with a history of hypertension, presented with slurred speech and issues with fine motor skills, particularly in the right hand. The patient reported difficulty picking up items and a change in handwriting. The patient also reported a slight weakness on the right side. The patient was admitted to the hospital, where a CT scan and an MRI revealed an infarct in the left corona radiata. The patient's blood pressure was high upon admission. The patient has not yet seen a neurologist or started therapy. The patient also has concerns about returning to work due to the physical nature of the job.      04/17/2023   10:30 AM 01/17/2023    8:10 AM 07/11/2022    7:37 AM 04/04/2022    7:33 AM 12/28/2020    2:13 PM  Depression screen PHQ 2/9  Decreased Interest 0 0 0 0 0  Down, Depressed, Hopeless 0 0 0 0 0  PHQ - 2 Score 0 0 0 0 0        04/17/2023   10:30 AM  Fall Risk   Falls in the past year? 0  Number falls in past yr: 0  Injury with Fall? 0  Risk for fall due to : No Fall Risks    Patient Care Team: Blane Ohara, MD as PCP - General (Family Medicine)   Review of Systems  Constitutional:  Negative for chills, fatigue and fever.  HENT:  Negative for congestion, ear pain, rhinorrhea and sore throat.   Respiratory:  Negative for cough and shortness of breath.   Cardiovascular:  Negative for chest pain.  Gastrointestinal:  Negative for abdominal pain, constipation, diarrhea, nausea and vomiting.  Genitourinary:  Negative for dysuria and urgency.  Musculoskeletal:  Negative for back pain and myalgias.  Neurological:  Negative for dizziness, weakness, light-headedness and headaches.  Psychiatric/Behavioral:  Negative for dysphoric mood. The patient is not  nervous/anxious.     Current Outpatient Medications on File Prior to Visit  Medication Sig Dispense Refill   aspirin EC 81 MG tablet Take 81 mg by mouth daily. Swallow whole.     atorvastatin (LIPITOR) 40 MG tablet Take 40 mg by mouth daily.     clopidogrel (PLAVIX) 75 MG tablet Take 75 mg by mouth daily.     icosapent Ethyl (VASCEPA) 1 g capsule Take 1 capsule by mouth twice daily 180 capsule 0   levothyroxine (EUTHYROX) 150 MCG tablet Take 1 tablet (150 mcg total) by mouth daily before breakfast. 90 tablet 3   losartan (COZAAR) 100 MG tablet Take 1 tablet by mouth once daily 90 tablet 1   Multiple Vitamin (MULTIVITAMIN) tablet Take 1 tablet by mouth 2 (two) times daily.     oxybutynin (DITROPAN XL) 15 MG 24 hr tablet Take 1 tablet by mouth once daily 90 tablet 3   triamcinolone (KENALOG) 0.025 % ointment Apply 1 application. topically 2 (two) times daily. 30 g 0   Vitamin D, Ergocalciferol, (DRISDOL) 1.25 MG (50000 UNIT) CAPS capsule Take 1 capsule by mouth once a week 12 capsule 0   No current facility-administered medications on file prior to visit.   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   Other iron deficiency anemias 04/28/2019   Overactive bladder 04/28/2019   Past Surgical History:  Procedure Laterality Date   CESAREAN SECTION  1999   CHOLECYSTECTOMY  1990   GASTRIC BYPASS  2008    Family History  Problem Relation Age of Onset   Pulmonary embolism Mother    Arthritis/Rheumatoid Mother    Coronary artery disease Father    Diverticulitis Father    Arthritis Father    Lung cancer Maternal Grandfather    Breast cancer Maternal Aunt    Social History   Socioeconomic History   Marital status: Married    Spouse name: Not on file   Number of children: 1   Years of education: Not on file   Highest education level: Not on file  Occupational History    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, nature  Tobacco Use   Smoking status: Never   Smokeless tobacco: Never  Vaping Use   Vaping status: Never Used  Substance and Sexual Activity   Alcohol use: Yes    Comment: rare   Drug use: Never   Sexual activity: Not Currently  Other Topics Concern   Not on file  Social History Narrative   Not on file   Social Drivers of Health   Financial Resource Strain: Low Risk  (01/17/2023)   Overall Financial Resource Strain (CARDIA)    Difficulty of Paying Living Expenses: Not very hard  Food Insecurity: No Food Insecurity (01/17/2023)   Hunger Vital Sign    Worried About Running Out of Food in the Last Year: Never true    Ran Out of Food in the Last Year: Never true  Transportation Needs: No Transportation Needs (01/17/2023)   PRAPARE - Administrator, Civil Service (Medical): No    Lack of Transportation (Non-Medical): No  Physical Activity: Sufficiently Active (01/17/2023)   Exercise Vital Sign    Days of Exercise per Week: 5 days    Minutes of Exercise per Session: 40 min  Stress: No Stress Concern Present (01/17/2023)   [employer] of Occupational Health - Occupational Stress Questionnaire    Feeling of Stress : Only a little  Social Connections: Moderately Isolated (01/17/2023)   Social Connection and Isolation Panel [NHANES]    Frequency of Communication with Friends and Family: More than three times a week    Frequency of Social Gatherings with Friends and Family: More than three times a week    Attends Religious Services: Never    Database administrator or Organizations: No    Attends Engineer, structural: Never    Marital Status: Married    Objective:  BP (!) 150/82   Pulse 82   Temp 97.6 F (36.4 C)   Ht 5' 1.5" (1.562 m)   Wt 203 lb (92.1 kg)   SpO2 100%   BMI 37.74 kg/m      04/25/2023   10:40 AM 04/25/2023   10:25 AM 04/17/2023   10:28 AM  BP/Weight  Systolic BP 150 148 132  Diastolic BP 82 100 90  Wt.  (Lbs)  203 204  BMI  37.74 kg/m2 37.92 kg/m2    Physical Exam Vitals reviewed.  Constitutional:      Appearance: Normal appearance. She is normal weight.  Neck:     Vascular: No carotid bruit.  Cardiovascular:     Rate and Rhythm: Normal rate and regular rhythm.     Heart sounds: Normal heart sounds.  Pulmonary:  Effort: Pulmonary effort is normal. No respiratory distress.     Breath sounds: Normal breath sounds.  Abdominal:     General: Abdomen is flat. Bowel sounds are normal.     Palpations: Abdomen is soft.     Tenderness: There is no abdominal tenderness.  Neurological:     Mental Status: She is alert and oriented to person, place, and time.  Psychiatric:        Mood and Affect: Mood normal.        Behavior: Behavior normal.     Diabetic Foot Exam - Simple   No data filed      Lab Results  Component Value Date   WBC 7.5 04/17/2023   HGB 12.8 04/17/2023   HCT 40.8 04/17/2023   PLT 340 04/17/2023   GLUCOSE 101 (H) 01/17/2023   CHOL 160 01/17/2023   TRIG 141 01/17/2023   HDL 34 (L) 01/17/2023   LDLCALC 101 (H) 01/17/2023   ALT 19 01/17/2023   AST 25 01/17/2023   NA 139 01/17/2023   K 4.3 01/17/2023   CL 102 01/17/2023   CREATININE 0.70 01/17/2023   BUN 13 01/17/2023   CO2 22 01/17/2023   TSH 4.260 04/17/2023   HGBA1C 6.0 (H) 04/17/2023      Assessment & Plan:    Hemiparesis of right dominant side as late effect of cerebral infarction [facility]) Assessment & Plan: Recent stroke with residual right-sided weakness and fine motor skill impairment. No significant carotid stenosis or cardiac source of emboli identified. Mild dysarthria noted. -Continue Aspirin and Plavix for 90 days, then transition to Aspirin alone. -Initiate Atorvastatin 40mg  daily for cholesterol management. -Consider outpatient physical and occupational therapy if self-directed exercises do not show improvement within a week. -Order ambulatory cardiac monitoring to rule out intermittent  atrial fibrillation as a potential source of emboli.  -Complete FMLA paperwork for patient's work, recommending medical leave until May 26, 2023 due to recent stroke and ongoing recovery. -Schedule follow-up appointment with neurology.  Orders: -     LONG TERM MONITOR (3-14 DAYS); Future  Essential hypertension Assessment & Plan: Blood pressure elevated at current visit. Patient has been taking Amlodipine 5mg  instead of the recommended 10mg . -Increase Amlodipine to 10mg  daily. -Check blood pressure in 2-3 weeks to assess response to medication adjustment.  Orders: -     amLODIPine Besylate; Take 1 tablet (10 mg total) by mouth daily.  Dispense: 90 tablet; Refill: 0     Meds ordered this encounter  Medications   amLODipine (NORVASC) 10 MG tablet    Sig: Take 1 tablet (10 mg total) by mouth daily.    Dispense:  90 tablet    Refill:  0    Orders Placed This Encounter  Procedures   LONG TERM MONITOR (3-14 DAYS)       Follow-up: Return in about 3 weeks (around 05/16/2023) for chronic follow up, Dr. Faylene Kurtz.   I,Katherina A Bramblett,acting as a scribe for Blane Ohara, MD.,have documented all relevant documentation on the behalf of Blane Ohara, MD,as directed by  Blane Ohara, MD while in the presence of Blane Ohara, MD.   Clayborn Bigness I Leal-Borjas,acting as a scribe for Blane Ohara, MD.,have documented all relevant documentation on the behalf of Blane Ohara, MD,as directed by  Blane Ohara, MD while in the presence of Blane Ohara, MD.    An After Visit Summary was printed and given to the patient.  Blane Ohara, MD [facility] [phone]

## 2023-04-25 NOTE — Patient Instructions (Signed)
Hypertension: Increase amlodipine to 10 mg daily.  Sent new prescription.  Call neurology to get a follow-up appointment.  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  If no improvement by early next week please call and we will refer you to speech therapy and occupational therapy.  I am putting you out of work until May 26, 2023.  For the stroke: Continue aspirin, Plavix, Lipitor, and increased dose of amlodipine.  Follow-up in 2 to 3 weeks with Dr. Faylene Kurtz for blood pressure.

## 2023-04-25 NOTE — Progress Notes (Unsigned)
EP to read.

## 2023-04-28 DIAGNOSIS — I69351 Hemiplegia and hemiparesis following cerebral infarction affecting right dominant side: Secondary | ICD-10-CM

## 2023-04-29 DIAGNOSIS — I69351 Hemiplegia and hemiparesis following cerebral infarction affecting right dominant side: Secondary | ICD-10-CM | POA: Insufficient documentation

## 2023-04-29 HISTORY — DX: Hemiplegia and hemiparesis following cerebral infarction affecting right dominant side: I69.351

## 2023-04-29 NOTE — Assessment & Plan Note (Addendum)
Recent stroke with residual right-sided weakness and fine motor skill impairment. No significant carotid stenosis or cardiac source of emboli identified. Mild dysarthria noted. -Continue Aspirin and Plavix for 90 days, then transition to Aspirin alone. -Initiate Atorvastatin 40mg  daily for cholesterol management. -Consider outpatient physical and occupational therapy if self-directed exercises do not show improvement within a week. -Order ambulatory cardiac monitoring to rule out intermittent atrial fibrillation as a potential source of emboli.  -Complete FMLA paperwork for patient's work, recommending medical leave until May 26, 2023 due to recent stroke and ongoing recovery. -Schedule follow-up appointment with neurology.

## 2023-04-29 NOTE — Assessment & Plan Note (Signed)
Blood pressure elevated at current visit. Patient has been taking Amlodipine 5mg  instead of the recommended 10mg . -Increase Amlodipine to 10mg  daily. -Check blood pressure in 2-3 weeks to assess response to medication adjustment.

## 2023-04-30 DIAGNOSIS — R4701 Aphasia: Secondary | ICD-10-CM | POA: Insufficient documentation

## 2023-04-30 NOTE — Assessment & Plan Note (Addendum)
Improving, but still has to think considerably to get correct words.  I discussed speech therapy exercises she can work on

## 2023-04-30 NOTE — Addendum Note (Signed)
Addended byBlane Ohara on: 04/30/2023 06:46 PM   Modules accepted: Orders

## 2023-05-01 ENCOUNTER — Encounter: Payer: Self-pay | Admitting: Family Medicine

## 2023-05-02 ENCOUNTER — Telehealth: Payer: Self-pay | Admitting: Family Medicine

## 2023-05-02 NOTE — Telephone Encounter (Signed)
 [facility] AT [facility]

## 2023-05-07 ENCOUNTER — Ambulatory Visit: Payer: BC Managed Care – PPO | Admitting: Neurology

## 2023-05-07 ENCOUNTER — Encounter: Payer: Self-pay | Admitting: Neurology

## 2023-05-07 VITALS — BP 132/83 | HR 81 | Ht 61.5 in | Wt 208.0 lb

## 2023-05-07 DIAGNOSIS — R0683 Snoring: Secondary | ICD-10-CM | POA: Diagnosis not present

## 2023-05-07 DIAGNOSIS — I639 Cerebral infarction, unspecified: Secondary | ICD-10-CM | POA: Diagnosis not present

## 2023-05-07 NOTE — Progress Notes (Signed)
 Chief Complaint  Patient presents with   New Patient (Initial Visit)    Pt in 15, here with husband Jonell Neptune  Pt is referred for hemiparesis of right dominant side as late effect after stroke on Jan/21/2025. Pt states her right side is weak, states her speech has been affected as well.       ASSESSMENT AND PLAN  Ashley Colon is a 63 y.o. female   Acute stroke  Vascular risk factor of obesity, hypertension hyperlipidemia,  At risk for obstructive sleep apnea  Refer to sleep study  Have complete stroke evaluation, will overlap aspirin plus Plavix  for 90 days then aspirin alone  DIAGNOSTIC DATA (LABS, IMAGING, TESTING) - I reviewed patient records, labs, notes, testing and imaging myself where available.   MEDICAL HISTORY:  Ashley Colon, is a 63 year old right-handed female, accompanied by her husband seen in request by her primary care from [address]  [facility] Stall to follow-up on stroke, initial evaluation was on May 07, 2023    History is obtained from the patient and review of electronic medical records. I personally reviewed pertinent available imaging films in PACS.   PMHx of  HTN Hypothyroidism HLD Gastric bypass 2008, lost 80-100 Lb, with some weight gain later.  She works at [employer] as a Nature conservation officer, 4 AM to 1 PM, not sleeping well, often go to bed around 11 PM, get up around 2 AM, taking frequent nap during the day, had some snoring  On April 16, 2023, while at work, she was noted to have mild slurred speech, things dropping out from her right hand, dragging her right leg while walking,  Eventually was sent by primary care to [facility], reviewed [facility] record, hospital admission on April 21, 2023, repeat blood pressure was elevated 177/81, LDL 105, A1c was 5.8, MRI of the brain reviewed subacute infarction at the left corona radiata, MRA of the head and neck with multifocal areas of stenosis in MCA PCA, echocardiogram showed no PFO with  normal ejection fraction,  Amlodipine  dosage was increased from 5 to 10 mg daily, l105, was placed on statin, discharged with aspirin 81+ Plavix  75 mg overlapping for 3 months, then aspirin alone  Laboratory evaluations, creatinine 0.6, hemoglobin of 11.9,  Her right side weakness have some improvement, but not back to her baseline yet   PHYSICAL EXAM:   Vitals:   05/07/23 1041  BP: 132/83  Pulse: 81  Weight: 208 lb (94.3 kg)  Height: 5' 1.5" (1.562 m)   Not recorded     Body mass index is 38.66 kg/m.  PHYSICAL EXAMNIATION:  Gen: NAD, conversant, well nourised, well groomed                     Cardiovascular: Regular rate rhythm, no peripheral edema, warm, nontender. Eyes: Conjunctivae clear without exudates or hemorrhage Neck: Supple, no carotid bruits. Pulmonary: Clear to auscultation bilaterally   NEUROLOGICAL EXAM:  MENTAL STATUS: Speech/cognition: Awake, alert, oriented to history taking and casual conversation CRANIAL NERVES: CN II: Visual fields are full to confrontation. Pupils are round equal and briskly reactive to light. CN III, IV, VI: extraocular movement are normal. No ptosis. CN V: Facial sensation is intact to light touch CN VII: Face is symmetric with normal eye closure  CN VIII: Hearing is normal to causal conversation. CN IX, X: Phonation is normal. CN XI: Head turning and shoulder shrug are intact CN XII: Narrow oropharyngeal space with  MOTOR: Mild fixation of right arm  upon rapid rotating movement,  REFLEXES: Hyperreflexia of right upper and lower extremity SENSORY: Intact to light touch, pinprick and vibratory sensation are intact in fingers and toes.  COORDINATION: There is no trunk or limb dysmetria noted.  GAIT/STANCE: Posture is normal. Gait is steady   REVIEW OF SYSTEMS:  Full 14 system review of systems performed and notable only for as above All other review of systems were negative.   ALLERGIES: Allergies  Allergen  Reactions   Oxycodone-Acetaminophen Rash and Itching    HOME MEDICATIONS: Current Outpatient Medications  Medication Sig Dispense Refill   amLODipine  (NORVASC ) 10 MG tablet Take 1 tablet (10 mg total) by mouth daily. 90 tablet 0   aspirin EC 81 MG tablet Take 81 mg by mouth daily. Swallow whole.     atorvastatin  (LIPITOR) 40 MG tablet Take 40 mg by mouth daily.     clopidogrel  (PLAVIX ) 75 MG tablet Take 75 mg by mouth daily.     icosapent  Ethyl (VASCEPA ) 1 g capsule Take 1 capsule by mouth twice daily 180 capsule 0   levothyroxine  (EUTHYROX ) 150 MCG tablet Take 1 tablet (150 mcg total) by mouth daily before breakfast. 90 tablet 3   losartan  (COZAAR ) 100 MG tablet Take 1 tablet by mouth once daily 90 tablet 1   Multiple Vitamin (MULTIVITAMIN) tablet Take 1 tablet by mouth 2 (two) times daily.     oxybutynin  (DITROPAN  XL) 15 MG 24 hr tablet Take 1 tablet by mouth once daily 90 tablet 3   Vitamin D , Ergocalciferol , (DRISDOL ) 1.25 MG (50000 UNIT) CAPS capsule Take 1 capsule by mouth once a week 12 capsule 0   triamcinolone  (KENALOG ) 0.025 % ointment Apply 1 application. topically 2 (two) times daily. (Patient not taking: Reported on 05/07/2023) 30 g 0   No current facility-administered medications for this visit.    PAST MEDICAL HISTORY: 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   Overactive bladder 04/28/2019    PAST SURGICAL HISTORY: Past Surgical History:  Procedure Laterality Date   CESAREAN SECTION  1999   CHOLECYSTECTOMY  1990   GASTRIC BYPASS  2008    FAMILY HISTORY: Family History  Problem Relation Age of Onset   Stroke Mother    Pulmonary embolism Mother    Arthritis/Rheumatoid Mother    Coronary artery disease Father    Diverticulitis Father    Arthritis Father    Breast cancer Maternal Aunt    Lung cancer  Maternal Grandfather    Stroke Maternal Grandmother     SOCIAL HISTORY: Social History   Socioeconomic History   Marital status: Married    Spouse name: Not on file   Number of children: 1   Years of education: Not on file   Highest education level: Not on file  Occupational History   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, nature  Tobacco Use   Smoking status: Never   Smokeless tobacco: Never  Vaping Use   Vaping status: Never Used  Substance and Sexual Activity   Alcohol use: Yes    Comment: rare   Drug use: Never   Sexual activity: Not Currently  Other Topics Concern   Not on file  Social History Narrative   Not on file   Social Drivers of Health   Financial Resource Strain: Low Risk  (01/17/2023)   Overall Financial Resource Strain (CARDIA)    Difficulty of Paying  Living Expenses: Not very hard  Food Insecurity: No Food Insecurity (01/17/2023)   Hunger Vital Sign    Worried About Running Out of Food in the Last Year: Never true    Ran Out of Food in the Last Year: Never true  Transportation Needs: No Transportation Needs (01/17/2023)   PRAPARE - Administrator, Civil Service (Medical): No    Lack of Transportation (Non-Medical): No  Physical Activity: Sufficiently Active (01/17/2023)   Exercise Vital Sign    Days of Exercise per Week: 5 days    Minutes of Exercise per Session: 40 min  Stress: No Stress Concern Present (01/17/2023)   [employer] of Occupational Health - Occupational Stress Questionnaire    Feeling of Stress : Only a little  Social Connections: Moderately Isolated (01/17/2023)   Social Connection and Isolation Panel [NHANES]    Frequency of Communication with Friends and Family: More than three times a week    Frequency of Social Gatherings with Friends and Family: More than three times a week    Attends Religious Services: Never    Database administrator or Organizations: No    Attends Banker Meetings: Never    Marital Status:  Married  Catering manager Violence: Not At Risk (01/17/2023)   Humiliation, Afraid, Rape, and Kick questionnaire    Fear of Current or Ex-Partner: No    Emotionally Abused: No    Physically Abused: No    Sexually Abused: No      Phebe Brasil, M.D. Ph.D.  [facility] [address] [place], [place] [address] Ph: [phone] Fax: [phone]  CC:  Mercy Stall, MD [address] [place],  [place] [address]  Mercy Stall, MD

## 2023-05-17 ENCOUNTER — Other Ambulatory Visit: Payer: Self-pay | Admitting: Family Medicine

## 2023-05-17 ENCOUNTER — Ambulatory Visit: Payer: BC Managed Care – PPO

## 2023-05-17 VITALS — BP 124/68 | HR 78 | Temp 97.4°F | Ht 61.5 in | Wt 210.8 lb

## 2023-05-17 DIAGNOSIS — E66812 Obesity, class 2: Secondary | ICD-10-CM | POA: Diagnosis not present

## 2023-05-17 DIAGNOSIS — Z6838 Body mass index (BMI) 38.0-38.9, adult: Secondary | ICD-10-CM

## 2023-05-17 DIAGNOSIS — R0683 Snoring: Secondary | ICD-10-CM | POA: Diagnosis not present

## 2023-05-17 DIAGNOSIS — I69351 Hemiplegia and hemiparesis following cerebral infarction affecting right dominant side: Secondary | ICD-10-CM | POA: Diagnosis not present

## 2023-05-17 DIAGNOSIS — I10 Essential (primary) hypertension: Secondary | ICD-10-CM | POA: Diagnosis not present

## 2023-05-17 DIAGNOSIS — E782 Mixed hyperlipidemia: Secondary | ICD-10-CM

## 2023-05-17 MED ORDER — CLOPIDOGREL BISULFATE 75 MG PO TABS: 75.0000 mg | ORAL_TABLET | Freq: Every day | ORAL | 0 refills | Status: DC

## 2023-05-17 MED ORDER — ATORVASTATIN CALCIUM 40 MG PO TABS: 40.0000 mg | ORAL_TABLET | Freq: Every day | ORAL | 3 refills | Status: DC

## 2023-05-17 NOTE — Patient Instructions (Signed)
  Today, we reviewed your recovery progress after your minor stroke. We discussed your current medications, symptoms, and the importance of managing your blood pressure, cholesterol, and overall health to prevent future vascular events. We also talked about the possibility of obstructive sleep apnea and the steps to diagnose and manage it.  YOUR PLAN:  -CEREBROVASCULAR ACCIDENT (CVA): A cerebrovascular accident, or stroke, occurs when blood flow to a part of the brain is interrupted, causing brain cells to die. You experienced a mild stroke with an infarct in the left corona radiata. Your symptoms have improved but not fully recovered. Continue taking aspirin and Plavix until April 25, then switch to aspirin alone. Your blood pressure medication, amlodipine, has been increased to 10 mg. Keep doing speech and fine motor skill exercises at home. Follow up with your neurologist as needed.  -HYPERTENSION: Hypertension, or high blood pressure, is a condition where the force of the blood against your artery walls is too high. Managing your blood pressure is crucial after a stroke. Continue taking amlodipine 10 mg and monitor your blood pressure regularly.  -HYPERLIPIDEMIA: Hyperlipidemia is a condition where there are high levels of fats (lipids) in your blood. This increases the risk of vascular events. You are on atorvastatin 40 mg to manage your cholesterol levels. Continue taking this medication and monitor your lipid levels regularly.  -OBSTRUCTIVE SLEEP APNEA (OSA) - SUSPECTED: Obstructive sleep apnea is a condition where your breathing repeatedly stops and starts during sleep. You are at risk due to your hypertension, hyperlipidemia, and weight. We discussed the importance of diagnosing and treating OSA. A home sleep study has been ordered, and if it is positive, you will follow up with a sleep lab study.  -GENERAL HEALTH MAINTENANCE: Making lifestyle changes, such as improving your diet and increasing  physical activity, is important for your overall health and reducing the risk of future vascular events. Aim for a healthy diet and regular exercise.  INSTRUCTIONS:  Follow up on heart monitor results in one week. Extend your FMLA leave until March 15. Schedule a follow-up appointment in two months with Dr. Sedalia Muta.

## 2023-05-17 NOTE — Progress Notes (Unsigned)
Subjective:  Patient ID: Ashley Colon, female    DOB: 1961/02/10  Age: 63 y.o. MRN: [id number]  No chief complaint on file.   HPI   Patient is in today for a 3 week follow up. Patient states she has noticed improvement but gets extremely tired quickly.     05/17/2023    2:51 PM 04/17/2023   10:30 AM 01/17/2023    8:10 AM 07/11/2022    7:37 AM 04/04/2022    7:33 AM  Depression screen PHQ 2/9  Decreased Interest 0 0 0 0 0  Down, Depressed, Hopeless 0 0 0 0 0  PHQ - 2 Score 0 0 0 0 0        05/17/2023    2:51 PM  Fall Risk   Falls in the past year? 0  Number falls in past yr: 0  Injury with Fall? 0  Risk for fall due to : No Fall Risks    Patient Care Team: Blane Ohara, MD as PCP - General (Family Medicine)   Review of Systems  Constitutional:  Positive for fatigue. Negative for chills and fever.  HENT:  Negative for congestion, ear pain and sore throat.   Respiratory:  Negative for cough and shortness of breath.   Cardiovascular:  Negative for chest pain.  Gastrointestinal:  Negative for abdominal pain, constipation, diarrhea, nausea and vomiting.  Genitourinary:  Negative for dysuria and frequency.  Musculoskeletal:  Negative for arthralgias and myalgias.  Neurological:  Negative for dizziness and headaches.  Psychiatric/Behavioral:  Negative for dysphoric mood. The patient is not nervous/anxious.     Current Outpatient Medications on File Prior to Visit  Medication Sig Dispense Refill   amLODipine (NORVASC) 10 MG tablet Take 1 tablet (10 mg total) by mouth daily. 90 tablet 0   aspirin EC 81 MG tablet Take 81 mg by mouth daily. Swallow whole.     atorvastatin (LIPITOR) 40 MG tablet Take 40 mg by mouth daily.     clopidogrel (PLAVIX) 75 MG tablet Take 75 mg by mouth daily.     icosapent Ethyl (VASCEPA) 1 g capsule Take 1 capsule by mouth twice daily 180 capsule 0   losartan (COZAAR) 100 MG tablet Take 1 tablet by mouth once daily 90 tablet 1   Multiple Vitamin  (MULTIVITAMIN) tablet Take 1 tablet by mouth 2 (two) times daily.     oxybutynin (DITROPAN XL) 15 MG 24 hr tablet Take 1 tablet by mouth once daily 90 tablet 3   triamcinolone (KENALOG) 0.025 % ointment Apply 1 application. topically 2 (two) times daily. 30 g 0   Vitamin D, Ergocalciferol, (DRISDOL) 1.25 MG (50000 UNIT) CAPS capsule Take 1 capsule by mouth once a week 12 capsule 0   No current facility-administered medications on file prior to visit.   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   Overactive bladder 04/28/2019   Past Surgical History:  Procedure Laterality Date   CESAREAN SECTION  1999   CHOLECYSTECTOMY  1990   GASTRIC BYPASS  2008    Family History  Problem Relation Age of Onset   Stroke Mother    Pulmonary embolism Mother    Arthritis/Rheumatoid Mother    Coronary artery disease Father    Diverticulitis Father    Arthritis Father    Breast cancer Maternal Aunt    Lung cancer Maternal Grandfather  Stroke Maternal Grandmother    Social History   Socioeconomic History   Marital status: Married    Spouse name: Not on file   Number of children: 1   Years of education: Not on file   Highest education level: Not on file  Occupational History   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, nature  Tobacco Use   Smoking status: Never   Smokeless tobacco: Never  Vaping Use   Vaping status: Never Used  Substance and Sexual Activity   Alcohol use: Yes    Comment: rare   Drug use: Never   Sexual activity: Not Currently  Other Topics Concern   Not on file  Social History Narrative   Not on file   Social Drivers of Health   Financial Resource Strain: Low Risk  (01/17/2023)   Overall Financial Resource Strain (CARDIA)    Difficulty of Paying Living Expenses: Not very hard  Food Insecurity: No Food Insecurity (01/17/2023)    Hunger Vital Sign    Worried About Running Out of Food in the Last Year: Never true    Ran Out of Food in the Last Year: Never true  Transportation Needs: No Transportation Needs (01/17/2023)   PRAPARE - Administrator, Civil Service (Medical): No    Lack of Transportation (Non-Medical): No  Physical Activity: Sufficiently Active (01/17/2023)   Exercise Vital Sign    Days of Exercise per Week: 5 days    Minutes of Exercise per Session: 40 min  Stress: No Stress Concern Present (01/17/2023)   [employer] of Occupational Health - Occupational Stress Questionnaire    Feeling of Stress : Only a little  Social Connections: Moderately Isolated (01/17/2023)   Social Connection and Isolation Panel [NHANES]    Frequency of Communication with Friends and Family: More than three times a week    Frequency of Social Gatherings with Friends and Family: More than three times a week    Attends Religious Services: Never    Database administrator or Organizations: No    Attends Engineer, structural: Never    Marital Status: Married    Objective:  BP 124/68   Pulse 78   Temp (!) 97.4 F (36.3 C)   Ht 5' 1.5" (1.562 m)   Wt 210 lb 12.8 oz (95.6 kg)   SpO2 98%   BMI 39.19 kg/m      05/17/2023    2:49 PM 05/07/2023   10:41 AM 04/25/2023   10:40 AM  BP/Weight  Systolic BP 124 132 150  Diastolic BP 68 83 82  Wt. (Lbs) 210.8 208   BMI 39.19 kg/m2 38.66 kg/m2     Physical Exam  Diabetic Foot Exam - Simple   No data filed      Lab Results  Component Value Date   WBC 7.5 04/17/2023   HGB 12.8 04/17/2023   HCT 40.8 04/17/2023   PLT 340 04/17/2023   GLUCOSE 101 (H) 01/17/2023   CHOL 160 01/17/2023   TRIG 141 01/17/2023   HDL 34 (L) 01/17/2023   LDLCALC 101 (H) 01/17/2023   ALT 19 01/17/2023   AST 25 01/17/2023   NA 139 01/17/2023   K 4.3 01/17/2023   CL 102 01/17/2023   CREATININE 0.70 01/17/2023   BUN 13 01/17/2023   CO2 22 01/17/2023   TSH 4.260  04/17/2023   HGBA1C 6.0 (H) 04/17/2023      Assessment & Plan:    Snoring -     Home sleep test  Class 2 severe obesity due to excess calories with serious comorbidity and body mass index (BMI) of 38.0 to 38.9 in adult [facility]) -     Home sleep test  Hemiparesis of right dominant side as late effect of cerebral infarction (HCC) -     Home sleep test     No orders of the defined types were placed in this encounter.   Orders Placed This Encounter  Procedures   Home sleep test     Follow-up: No follow-ups on file.   I,Candice Gribble,acting as a Neurosurgeon for [employer], MD.,have documented all relevant documentation on the behalf of Windell Moment, MD,as directed by  Windell Moment, MD while in the presence of Windell Moment, MD.   An After Visit Summary was printed and given to the patient.  Windell Moment, MD [facility] [phone]

## 2023-05-18 NOTE — Assessment & Plan Note (Signed)
Experienced a mild stroke with an infarct in the left corona radiata, confirmed by MRI. Initial symptoms included speech difficulties and fine motor skill impairment. Current symptoms show improvement but not full recovery. On aspirin and Plavix for 90 days, transitioning to aspirin alone around April 25. Blood pressure management and lifestyle changes are crucial to prevent future vascular events. Discussed the importance of early intervention within a six-hour window for stroke reversal and the increased risk of future strokes and myocardial infarctions. - Continue aspirin and Plavix until April 25, then transition to aspirin alone - Increase amlodipine to 10 mg for blood pressure control - Encourage speech and fine motor skill exercises at home - Follow up with neurologist as needed

## 2023-05-18 NOTE — Assessment & Plan Note (Addendum)
On a high-dose statin (atorvastatin 40 mg) to manage cholesterol levels, essential for reducing the risk of future vascular events. Discussed the need for long-term statin therapy. - Continue atorvastatin 40 mg, 90-day supply - Monitor lipid levels regularly    General Health Maintenance Advised to make lifestyle changes, including diet and exercise, to improve overall health and reduce the risk of future vascular events. Discussed the importance of regular physical activity and a healthy diet. - Encourage healthy diet and regular physical activity  Follow-up - Follow up on heart monitor results in one week - Extend FMLA leave until March 15 - Schedule follow-up appointment in two months with Dr. Sedalia Muta.

## 2023-05-18 NOTE — Assessment & Plan Note (Signed)
Blood pressure management is critical post-stroke. Currently on amlodipine, increased to 10 mg recently. Discussed the importance of controlling blood pressure to reduce the risk of future vascular events. - Continue amlodipine 10 mg - Monitor blood pressure regularly

## 2023-05-18 NOTE — Assessment & Plan Note (Signed)
At risk for OSA due to hypertension, hyperlipidemia, and weight. Reports snoring and fatigue. Discussed the potential impact of untreated OSA on overall health and the importance of diagnosis and treatment. Explained the process and benefits of a home sleep study and potential follow-up with a sleep lab study if positive. - Order home sleep study - If home sleep study is positive, follow up with a sleep lab study

## 2023-05-24 ENCOUNTER — Encounter: Payer: Self-pay | Admitting: Family Medicine

## 2023-05-28 ENCOUNTER — Other Ambulatory Visit: Payer: Self-pay | Admitting: Family Medicine

## 2023-05-28 MED ORDER — ATOMOXETINE HCL 40 MG PO CAPS
40.0000 mg | ORAL_CAPSULE | Freq: Every day | ORAL | 0 refills | Status: DC
Start: 2023-05-28 — End: 2023-06-24

## 2023-06-05 ENCOUNTER — Encounter: Payer: Self-pay | Admitting: Family Medicine

## 2023-06-14 ENCOUNTER — Encounter: Payer: Self-pay | Admitting: Family Medicine

## 2023-06-19 ENCOUNTER — Other Ambulatory Visit: Payer: Self-pay

## 2023-06-19 MED ORDER — METOPROLOL SUCCINATE ER 25 MG PO TB24: 25.0000 mg | ORAL_TABLET | Freq: Every day | ORAL | 0 refills | Status: DC

## 2023-06-19 NOTE — Progress Notes (Signed)
 prescription for Toprol 25 mg daily has been sent to pharamacy.

## 2023-06-23 ENCOUNTER — Other Ambulatory Visit: Payer: Self-pay | Admitting: Family Medicine

## 2023-06-23 DIAGNOSIS — E559 Vitamin D deficiency, unspecified: Secondary | ICD-10-CM

## 2023-06-24 ENCOUNTER — Other Ambulatory Visit: Payer: Self-pay | Admitting: Family Medicine

## 2023-07-17 ENCOUNTER — Encounter: Payer: Self-pay | Admitting: Family Medicine

## 2023-07-17 ENCOUNTER — Other Ambulatory Visit: Payer: Self-pay | Admitting: Family Medicine

## 2023-07-17 DIAGNOSIS — M791 Myalgia, unspecified site: Secondary | ICD-10-CM

## 2023-07-17 NOTE — Progress Notes (Unsigned)
 Subjective:  Patient ID: Ashley Colon, female    DOB: 01/20/1961  Age: 63 y.o. MRN: [id number]  Chief Complaint  Patient presents with   Medical Management of Chronic Issues    HPI:  Hypertension: off amlodipine  when started on toprol  xl 25 mg daily, Losartan  100 mg daily.  SVT: on metoprolol  succinate xl 25 mg daily.   Urge incontinence: Taking Oxybutynin  15 mg daily. Works Firefighter.    Hypothyroidism: Takes Euthyrox  150 mcg daily.   ADHD: Currently strattera  40 mg daily. Helps her focus. Doing well. Changed from adderall.   Hyperlipidemia: Vascepa  2g twice a day.  Atorvastatin  40 mg nightly.   Vitamin D  deficiency: Taking vitamin D  50K weekly.   States she is having myalgias however this started after starting lipitor. More of a constant ache.    S/p stroke - minimal symptoms remaining. Sometimes notices when she is very tired.  States that it was recommended when she saw Dr. Gracie Lav however the patient says she is claustrophobic and would not feel to wear the CPAP mask.  Patient currently on Plavix  and aspirin.  Following 90 days of Plavix  she supposed to discontinue it and remain on aspirin lifelong.  Patient is nearly out of her Plavix  and this will be 3 months.  Iron deficiency anemia: This has been going on for some time.  Likely due to her bariatric surgery however she is due for colon cancer screening.  She is very reluctant to do colonoscopy and would prefer to do the Cologuard.  Reports previously taking a bariatric multivitamin which I reviewed today and does not have any calcium  but has more of other vitamins.  At her last visit I actually told her to stop this and take just a regular multivitamin and do more iron instead and she has been doing that.     07/18/2023    8:15 AM 05/17/2023    2:51 PM 04/17/2023   10:30 AM 01/17/2023    8:10 AM 07/11/2022    7:37 AM  Depression screen PHQ 2/9  Decreased Interest 0 0 0 0 0  Down, Depressed, Hopeless 0 0 0 0 0  PHQ - 2 Score  0 0 0 0 0        05/17/2023    2:51 PM  Fall Risk   Falls in the past year? 0  Number falls in past yr: 0  Injury with Fall? 0  Risk for fall due to : No Fall Risks    Patient Care Team: Mercy Stall, MD as PCP - General (Family Medicine)   Review of Systems  Constitutional:  Negative for chills, fatigue and fever.  HENT:  Negative for congestion, ear pain and sore throat.   Respiratory:  Negative for cough and shortness of breath.   Cardiovascular:  Negative for chest pain.  Gastrointestinal:  Negative for abdominal pain, constipation, diarrhea, nausea and vomiting.  Genitourinary:  Negative for dysuria and urgency.  Musculoskeletal:  Positive for myalgias. Negative for arthralgias.  Skin:  Negative for rash.  Neurological:  Negative for dizziness and headaches.  Psychiatric/Behavioral:  Negative for dysphoric mood. The patient is not nervous/anxious.     Current Outpatient Medications on File Prior to Visit  Medication Sig Dispense Refill   aspirin EC 81 MG tablet Take 81 mg by mouth daily. Swallow whole.     clopidogrel  (PLAVIX ) 75 MG tablet Take 1 tablet (75 mg total) by mouth daily. 60 tablet 0   icosapent  Ethyl (VASCEPA ) 1 g capsule  Take 1 capsule by mouth twice daily 180 capsule 0   levothyroxine  (SYNTHROID ) 150 MCG tablet TAKE 1 TABLET BY MOUTH ONCE DAILY BEFORE  BREAKFAST 90 tablet 0   losartan  (COZAAR ) 100 MG tablet Take 1 tablet by mouth once daily 90 tablet 1   metoprolol  succinate (TOPROL -XL) 25 MG 24 hr tablet Take 1 tablet (25 mg total) by mouth daily. 90 tablet 0   Multiple Vitamin (MULTIVITAMIN) tablet Take 1 tablet by mouth 2 (two) times daily.     oxybutynin  (DITROPAN  XL) 15 MG 24 hr tablet Take 1 tablet by mouth once daily 90 tablet 3   triamcinolone  (KENALOG ) 0.025 % ointment Apply 1 application. topically 2 (two) times daily. 30 g 0   Vitamin D , Ergocalciferol , (DRISDOL ) 1.25 MG (50000 UNIT) CAPS capsule Take 1 capsule by mouth once a week 12 capsule 0    No current facility-administered medications on file prior to visit.   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   Hemiparesis of right dominant side as late effect of cerebral infarction (HCC) 04/29/2023   Other iron deficiency anemias 04/28/2019   Overactive bladder 04/28/2019   Past Surgical History:  Procedure Laterality Date   CESAREAN SECTION  1999   CHOLECYSTECTOMY  1990   GASTRIC BYPASS  2008    Family History  Problem Relation Age of Onset   Stroke Mother    Pulmonary embolism Mother    Arthritis/Rheumatoid Mother    Coronary artery disease Father    Diverticulitis Father    Arthritis Father    Breast cancer Maternal Aunt    Lung cancer Maternal Grandfather    Stroke Maternal Grandmother    Social History   Socioeconomic History   Marital status: Married    Spouse name: Not on file   Number of children: 1   Years of education: Not on file   Highest education level: Not on file  Occupational History   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, nature  Tobacco Use   Smoking status: Never   Smokeless tobacco: Never  Vaping Use   Vaping status: Never Used  Substance and Sexual Activity   Alcohol use: Yes    Comment: rare   Drug use: Never   Sexual activity: Not Currently  Other Topics Concern   Not on file  Social History Narrative   Not on file   Social Drivers of Health   Financial Resource Strain: Low Risk  (01/17/2023)   Overall Financial Resource Strain (CARDIA)    Difficulty of Paying Living Expenses: Not very hard  Food Insecurity: No Food Insecurity (07/18/2023)   Hunger Vital Sign    Worried About Running Out of Food in the Last Year: Never true    Ran Out of Food in the Last Year: Never true  Transportation Needs: No Transportation Needs (07/18/2023)   PRAPARE - Administrator, Civil Service (Medical): No    Lack of  Transportation (Non-Medical): No  Physical Activity: Sufficiently Active (01/17/2023)   Exercise Vital Sign    Days of Exercise per Week: 5 days    Minutes of Exercise per Session: 40 min  Stress: No Stress Concern Present (01/17/2023)   [employer] of Occupational Health - Occupational Stress Questionnaire    Feeling of Stress : Only a little  Social Connections: Moderately Isolated (01/17/2023)   Social Connection and Isolation Panel [NHANES]    Frequency of Communication with  Friends and Family: More than three times a week    Frequency of Social Gatherings with Friends and Family: More than three times a week    Attends Religious Services: Never    Database administrator or Organizations: No    Attends Engineer, structural: Never    Marital Status: Married    Objective:  BP 136/88   Pulse 78   Temp 98.2 F (36.8 C)   Ht 5' 1.5" (1.562 m)   Wt 212 lb (96.2 kg)   SpO2 98%   BMI 39.41 kg/m      07/18/2023    8:03 AM 05/17/2023    2:49 PM 05/07/2023   10:41 AM  BP/Weight  Systolic BP 136 124 132  Diastolic BP 88 68 83  Wt. (Lbs) 212 210.8 208  BMI 39.41 kg/m2 39.19 kg/m2 38.66 kg/m2    Physical Exam Vitals reviewed.  Constitutional:      Appearance: Normal appearance. She is normal weight.  Neck:     Vascular: No carotid bruit.  Cardiovascular:     Rate and Rhythm: Normal rate and regular rhythm.     Heart sounds: Normal heart sounds.  Pulmonary:     Effort: Pulmonary effort is normal. No respiratory distress.     Breath sounds: Normal breath sounds.  Abdominal:     General: Abdomen is flat. Bowel sounds are normal.     Palpations: Abdomen is soft.     Tenderness: There is no abdominal tenderness.  Neurological:     Mental Status: She is alert and oriented to person, place, and time.  Psychiatric:        Mood and Affect: Mood normal.        Behavior: Behavior normal.     Diabetic Foot Exam - Simple   No data filed      Lab Results   Component Value Date   WBC 7.5 04/17/2023   HGB 12.8 04/17/2023   HCT 40.8 04/17/2023   PLT 340 04/17/2023   GLUCOSE 101 (H) 01/17/2023   CHOL 160 01/17/2023   TRIG 141 01/17/2023   HDL 34 (L) 01/17/2023   LDLCALC 101 (H) 01/17/2023   ALT 19 01/17/2023   AST 25 01/17/2023   NA 139 01/17/2023   K 4.3 01/17/2023   CL 102 01/17/2023   CREATININE 0.70 01/17/2023   BUN 13 01/17/2023   CO2 22 01/17/2023   TSH 4.260 04/17/2023   HGBA1C 6.0 (H) 04/17/2023      Assessment & Plan:  Essential hypertension Assessment & Plan: Well controlled.  No changes to medicines. Continue toprol  xl 25 mg daily, Losartan  100 mg daily. Continue to work on eating a healthy diet and exercise.  Labs drawn today.    Orders: -     CBC with Differential/Platelet -     Comprehensive metabolic panel with GFR  Hypothyroidism due to acquired atrophy of thyroid Assessment & Plan: Previously well controlled Continue Synthroid  at current dose    Impaired fasting glucose Assessment & Plan: Hemoglobin A1c 6%, 3 month avg of blood sugars, is in prediabetic range.  In order to prevent progression to diabetes, recommend low carb diet and regular exercise   Orders: -     Hemoglobin A1c  Urge incontinence Assessment & Plan: The current medical regimen is effective;  continue present plan and medications. Continue oxybutynin  15 mg daily.    Mixed hyperlipidemia Assessment & Plan: For her legs hurting: Stop Lipitor for 2 weeks.  Start  Crestor  5 mg nightly at that time.  Recommend call if muscle pain does not resolve. Recommend continue to work on eating healthy diet and exercise. Check labs.   Orders: -     Lipid panel -     Rosuvastatin  Calcium ; Take 1 tablet (5 mg total) by mouth daily.  Dispense: 90 tablet; Refill: 0  Attention deficit hyperactivity disorder (ADHD), predominantly inattentive type Assessment & Plan: The current medical regimen is effective;  continue present plan and  medications. Continue strattera  40 mg daily.    Encounter for screening mammogram for malignant neoplasm of breast -     3D Screening Mammogram, Left and Right; Future  Screening for colon cancer -     Cologuard  Other iron deficiency anemia Assessment & Plan: Check iron studies. Check cbc.  Continue iron sulfate and restart bariatric mvi.  Orders: -     Iron, TIBC and Ferritin Panel  Myalgia Assessment & Plan: For her legs hurting: Stop Lipitor for 2 weeks.  Start Crestor  5 mg nightly at that time.  Recommend call if muscle pain does not resolve.  Orders: -     Phosphorus -     Magnesium -     CK  SVT (supraventricular tachycardia) (HCC) Assessment & Plan: Continue metoprolol  xl 25 mg daily.    Morbid obesity (HCC) Assessment & Plan: Recommend continue to work on eating healthy diet and exercise. Comorbidities: hypertension    Other orders -     Atomoxetine  HCl; Take 1 capsule (40 mg total) by mouth daily.  Dispense: 90 capsule; Refill: 1     Meds ordered this encounter  Medications   rosuvastatin  (CRESTOR ) 5 MG tablet    Sig: Take 1 tablet (5 mg total) by mouth daily.    Dispense:  90 tablet    Refill:  0   atomoxetine  (STRATTERA ) 40 MG capsule    Sig: Take 1 capsule (40 mg total) by mouth daily.    Dispense:  90 capsule    Refill:  1    Orders Placed This Encounter  Procedures   MM 3D SCREENING MAMMOGRAM BILATERAL BREAST   CBC with Differential/Platelet   Comprehensive metabolic panel with GFR   Hemoglobin A1c   Lipid panel   Iron, TIBC and Ferritin Panel   Cologuard   Phosphorus   Magnesium   CK     Follow-up: Return in about 3 months (around 10/17/2023).   I,Marla I Leal-Borjas,acting as a scribe for Mercy Stall, MD.,have documented all relevant documentation on the behalf of Mercy Stall, MD,as directed by  Mercy Stall, MD while in the presence of Mercy Stall, MD.   An After Visit Summary was printed and given to the patient.  I attest  that I have reviewed this visit and agree with the plan scribed by my staff.   Mercy Stall, MD [facility] [phone]

## 2023-07-18 ENCOUNTER — Encounter: Payer: Self-pay | Admitting: Family Medicine

## 2023-07-18 ENCOUNTER — Ambulatory Visit: Payer: BC Managed Care – PPO | Admitting: Family Medicine

## 2023-07-18 VITALS — BP 136/88 | HR 78 | Temp 98.2°F | Ht 61.5 in | Wt 212.0 lb

## 2023-07-18 DIAGNOSIS — I69351 Hemiplegia and hemiparesis following cerebral infarction affecting right dominant side: Secondary | ICD-10-CM

## 2023-07-18 DIAGNOSIS — I10 Essential (primary) hypertension: Secondary | ICD-10-CM | POA: Diagnosis not present

## 2023-07-18 DIAGNOSIS — E782 Mixed hyperlipidemia: Secondary | ICD-10-CM

## 2023-07-18 DIAGNOSIS — D509 Iron deficiency anemia, unspecified: Secondary | ICD-10-CM | POA: Insufficient documentation

## 2023-07-18 DIAGNOSIS — N3941 Urge incontinence: Secondary | ICD-10-CM

## 2023-07-18 DIAGNOSIS — D508 Other iron deficiency anemias: Secondary | ICD-10-CM

## 2023-07-18 DIAGNOSIS — F900 Attention-deficit hyperactivity disorder, predominantly inattentive type: Secondary | ICD-10-CM

## 2023-07-18 DIAGNOSIS — I471 Supraventricular tachycardia, unspecified: Secondary | ICD-10-CM

## 2023-07-18 DIAGNOSIS — R7301 Impaired fasting glucose: Secondary | ICD-10-CM | POA: Diagnosis not present

## 2023-07-18 DIAGNOSIS — Z1231 Encounter for screening mammogram for malignant neoplasm of breast: Secondary | ICD-10-CM

## 2023-07-18 DIAGNOSIS — Z1211 Encounter for screening for malignant neoplasm of colon: Secondary | ICD-10-CM

## 2023-07-18 DIAGNOSIS — M791 Myalgia, unspecified site: Secondary | ICD-10-CM

## 2023-07-18 DIAGNOSIS — E034 Atrophy of thyroid (acquired): Secondary | ICD-10-CM

## 2023-07-18 MED ORDER — ROSUVASTATIN CALCIUM 5 MG PO TABS: 5.0000 mg | ORAL_TABLET | Freq: Every day | ORAL | 0 refills | Status: DC

## 2023-07-18 MED ORDER — ATOMOXETINE HCL 40 MG PO CAPS
40.0000 mg | ORAL_CAPSULE | Freq: Every day | ORAL | 1 refills | Status: DC
Start: 2023-07-18 — End: 2023-10-23

## 2023-07-18 NOTE — Assessment & Plan Note (Signed)
 For her legs hurting: Stop Lipitor for 2 weeks.  Start Crestor  5 mg nightly at that time.  Recommend call if muscle pain does not resolve.

## 2023-07-18 NOTE — Assessment & Plan Note (Signed)
The current medical regimen is effective;  continue present plan and medications. Continue oxybutynin 15 mg daily.

## 2023-07-18 NOTE — Assessment & Plan Note (Deleted)
 Check iron studies. Check cbc.  Continue iron sulfate and restart bariatric mvi.

## 2023-07-18 NOTE — Patient Instructions (Addendum)
 For her legs hurting: Stop Lipitor for 2 weeks.  Start Crestor  5 mg nightly at that time.  Recommend call if muscle pain does not resolve.  To prevent osteoporosis and recommend calcium  with vitamin D  1200 to 1500 mg daily. Restart bariatric multivitamin.  Await labs to determine if needs iron also.  Continue current blood pressure medications.

## 2023-07-18 NOTE — Assessment & Plan Note (Signed)
Hemoglobin A1c 6 %, 3 month avg of blood sugars, is in prediabetic range.  In order to prevent progression to diabetes, recommend low carb diet and regular exercise 

## 2023-07-18 NOTE — Assessment & Plan Note (Signed)
 For her legs hurting: Stop Lipitor for 2 weeks.  Start Crestor  5 mg nightly at that time.  Recommend call if muscle pain does not resolve. Recommend continue to work on eating healthy diet and exercise. Check labs.

## 2023-07-18 NOTE — Assessment & Plan Note (Signed)
 Continue metoprolol  xl 25 mg daily.

## 2023-07-18 NOTE — Assessment & Plan Note (Signed)
 Recommend continue to work on eating healthy diet and exercise. Comorbidities: hypertension

## 2023-07-18 NOTE — Assessment & Plan Note (Signed)
 Check iron studies. Check cbc.  Continue iron sulfate and restart bariatric mvi.

## 2023-07-18 NOTE — Assessment & Plan Note (Signed)
 Previously well controlled Continue Synthroid at current dose

## 2023-07-18 NOTE — Assessment & Plan Note (Signed)
 The current medical regimen is effective;  continue present plan and medications. Continue strattera  40 mg daily.

## 2023-07-18 NOTE — Assessment & Plan Note (Signed)
 Well controlled.  No changes to medicines. Continue toprol  xl 25 mg daily, Losartan  100 mg daily. Continue to work on eating a healthy diet and exercise.  Labs drawn today.

## 2023-07-19 ENCOUNTER — Encounter: Payer: Self-pay | Admitting: Family Medicine

## 2023-07-19 LAB — COMPREHENSIVE METABOLIC PANEL WITH GFR
ALT: 28 IU/L (ref 0–32)
AST: 24 IU/L (ref 0–40)
Albumin: 4.3 g/dL (ref 3.8–4.9)
Alkaline Phosphatase: 94 IU/L (ref 44–121)
BUN/Creatinine Ratio: 22 (ref 12–28)
BUN: 13 mg/dL (ref 8–27)
Bilirubin Total: 0.4 mg/dL (ref 0.0–1.2)
CO2: 26 mmol/L (ref 20–29)
Calcium: 9.2 mg/dL (ref 8.7–10.3)
Chloride: 105 mmol/L (ref 96–106)
Creatinine, Ser: 0.6 mg/dL (ref 0.57–1.00)
Globulin, Total: 2.6 g/dL (ref 1.5–4.5)
Glucose: 113 mg/dL — ABNORMAL HIGH (ref 70–99)
Potassium: 5 mmol/L (ref 3.5–5.2)
Sodium: 144 mmol/L (ref 134–144)
Total Protein: 6.9 g/dL (ref 6.0–8.5)
eGFR: 103 mL/min/{1.73_m2} (ref >59)

## 2023-07-19 LAB — LIPID PANEL
Chol/HDL Ratio: 3.8 ratio (ref 0.0–4.4)
Cholesterol, Total: 128 mg/dL (ref 100–199)
HDL: 34 mg/dL — ABNORMAL LOW (ref >39)
LDL Chol Calc (NIH): 65 mg/dL (ref 0–99)
Triglycerides: 173 mg/dL — ABNORMAL HIGH (ref 0–149)
VLDL Cholesterol Cal: 29 mg/dL (ref 5–40)

## 2023-07-19 LAB — CBC WITH DIFFERENTIAL/PLATELET
Basophils Absolute: 0.1 10*3/uL (ref 0.0–0.2)
Basos: 1 %
EOS (ABSOLUTE): 0.3 10*3/uL (ref 0.0–0.4)
Eos: 4 %
Hematocrit: 41.5 % (ref 34.0–46.6)
Hemoglobin: 13.5 g/dL (ref 11.1–15.9)
Immature Grans (Abs): 0 10*3/uL (ref 0.0–0.1)
Immature Granulocytes: 0 %
Lymphocytes Absolute: 2.2 10*3/uL (ref 0.7–3.1)
Lymphs: 31 %
MCH: 28.4 pg (ref 26.6–33.0)
MCHC: 32.5 g/dL (ref 31.5–35.7)
MCV: 87 fL (ref 79–97)
Monocytes Absolute: 0.5 10*3/uL (ref 0.1–0.9)
Monocytes: 7 %
Neutrophils Absolute: 3.9 10*3/uL (ref 1.4–7.0)
Neutrophils: 57 %
Platelets: 291 10*3/uL (ref 150–450)
RBC: 4.76 x10E6/uL (ref 3.77–5.28)
RDW: 14.8 % (ref 11.7–15.4)
WBC: 7 10*3/uL (ref 3.4–10.8)

## 2023-07-19 LAB — PHOSPHORUS: Phosphorus: 4.3 mg/dL (ref 3.0–4.3)

## 2023-07-19 LAB — CK: Total CK: 85 U/L (ref 32–182)

## 2023-07-19 LAB — IRON,TIBC AND FERRITIN PANEL
Ferritin: 24 ng/mL (ref 15–150)
Iron Saturation: 15 % (ref 15–55)
Iron: 54 ug/dL (ref 27–159)
Total Iron Binding Capacity: 371 ug/dL (ref 250–450)
UIBC: 317 ug/dL (ref 131–425)

## 2023-07-19 LAB — HEMOGLOBIN A1C
Est. average glucose Bld gHb Est-mCnc: 128 mg/dL
Hgb A1c MFr Bld: 6.1 % — ABNORMAL HIGH (ref 4.8–5.6)

## 2023-07-19 LAB — MAGNESIUM: Magnesium: 2.2 mg/dL (ref 1.6–2.3)

## 2023-08-09 ENCOUNTER — Encounter: Payer: Self-pay | Admitting: Family Medicine

## 2023-08-10 ENCOUNTER — Other Ambulatory Visit: Payer: Self-pay | Admitting: Family Medicine

## 2023-08-10 MED ORDER — EZETIMIBE 10 MG PO TABS: 10.0000 mg | ORAL_TABLET | Freq: Every day | ORAL | 0 refills | Status: DC

## 2023-08-14 ENCOUNTER — Other Ambulatory Visit: Payer: Self-pay | Admitting: Family Medicine

## 2023-08-15 ENCOUNTER — Other Ambulatory Visit: Payer: Self-pay | Admitting: Family Medicine

## 2023-08-15 DIAGNOSIS — N3941 Urge incontinence: Secondary | ICD-10-CM

## 2023-09-12 ENCOUNTER — Encounter

## 2023-09-15 ENCOUNTER — Other Ambulatory Visit: Payer: Self-pay | Admitting: Family Medicine

## 2023-09-15 DIAGNOSIS — E559 Vitamin D deficiency, unspecified: Secondary | ICD-10-CM

## 2023-09-16 ENCOUNTER — Other Ambulatory Visit: Payer: Self-pay | Admitting: Family Medicine

## 2023-09-21 ENCOUNTER — Other Ambulatory Visit: Payer: Self-pay | Admitting: Family Medicine

## 2023-09-22 ENCOUNTER — Other Ambulatory Visit: Payer: Self-pay | Admitting: Family Medicine

## 2023-09-22 DIAGNOSIS — I10 Essential (primary) hypertension: Secondary | ICD-10-CM

## 2023-10-19 ENCOUNTER — Other Ambulatory Visit: Payer: Self-pay | Admitting: Medical Genetics

## 2023-10-23 ENCOUNTER — Ambulatory Visit: Admitting: Family Medicine

## 2023-10-23 ENCOUNTER — Encounter: Payer: Self-pay | Admitting: Family Medicine

## 2023-10-23 VITALS — BP 122/80 | HR 81 | Temp 98.4°F | Ht 61.5 in | Wt 212.0 lb

## 2023-10-23 DIAGNOSIS — F900 Attention-deficit hyperactivity disorder, predominantly inattentive type: Secondary | ICD-10-CM | POA: Diagnosis not present

## 2023-10-23 DIAGNOSIS — R7301 Impaired fasting glucose: Secondary | ICD-10-CM

## 2023-10-23 DIAGNOSIS — I471 Supraventricular tachycardia, unspecified: Secondary | ICD-10-CM

## 2023-10-23 DIAGNOSIS — Z8673 Personal history of transient ischemic attack (TIA), and cerebral infarction without residual deficits: Secondary | ICD-10-CM

## 2023-10-23 DIAGNOSIS — E782 Mixed hyperlipidemia: Secondary | ICD-10-CM

## 2023-10-23 DIAGNOSIS — E559 Vitamin D deficiency, unspecified: Secondary | ICD-10-CM

## 2023-10-23 DIAGNOSIS — R252 Cramp and spasm: Secondary | ICD-10-CM

## 2023-10-23 DIAGNOSIS — Z1231 Encounter for screening mammogram for malignant neoplasm of breast: Secondary | ICD-10-CM

## 2023-10-23 DIAGNOSIS — E034 Atrophy of thyroid (acquired): Secondary | ICD-10-CM

## 2023-10-23 DIAGNOSIS — I6389 Other cerebral infarction: Secondary | ICD-10-CM

## 2023-10-23 DIAGNOSIS — I10 Essential (primary) hypertension: Secondary | ICD-10-CM

## 2023-10-23 MED ORDER — ATOMOXETINE HCL 80 MG PO CAPS: 80.0000 mg | ORAL_CAPSULE | Freq: Every day | ORAL | 1 refills | Status: DC

## 2023-10-23 NOTE — Assessment & Plan Note (Signed)
 Well controlled.  No changes to medicines. Continue toprol  xl 25 mg daily, Losartan  100 mg daily. Continue to work on eating a healthy diet and exercise.  Labs drawn today.

## 2023-10-23 NOTE — Patient Instructions (Signed)
 VISIT SUMMARY:  Today, you had a follow-up visit to discuss your ongoing health issues, including your history of stroke, attention deficit disorder, muscle cramps, and prediabetes. We reviewed your current medications and made some adjustments to better manage your symptoms and overall health.  YOUR PLAN:  ATTENTION DEFICIT DISORDER: You are currently taking Strattera  40 mg daily for attention deficit disorder. -Increase Strattera  to 80 mg daily. You can double up on your remaining 40 mg tablets.  MUSCLE CRAMPS AND LEG WEAKNESS: You have persistent muscle cramps and weakness in your legs, along with muscle vibrations or fasciculations. -Ensure you drink 64 ounces of fluid daily and avoid caffeinated beverages.  STROKE: Your speech has improved since your stroke, but you still have some difficulty with sound repetition. You are taking baby aspirin for prevention. -Continue taking baby aspirin as prescribed.  PREDIABETES: Your last hemoglobin A1c was 6.1, which is consistent with prediabetes. -We will order a repeat A1c test to monitor your condition.  OBESITY, STATUS POST BARIATRIC SURGERY: You are having difficulty losing weight after your stroke, despite eating healthily and avoiding fried foods. -Continue with your healthy eating habits and try to incorporate some physical activity as tolerated.  HYPERLIPIDEMIA: You have a history of high cholesterol and are currently managing it with Zetia  and Vascepa . -Continue taking Zetia  and Vascepa  as prescribed and follow dietary modifications.  VITAMIN D  DEFICIENCY: You have a history of vitamin D  deficiency. -Continue with your weekly vitamin D  supplementation.  GENERAL HEALTH MAINTENANCE: You need to complete some routine health screenings. -Complete the Cologuard test before returning to work. -Schedule your mammogram with the mobile unit for September or November.

## 2023-10-23 NOTE — Assessment & Plan Note (Signed)
 Persistent cramps and weakness with fasciculations. Normal magnesium and muscle protein levels. No medication link identified. - Ensure hydration with 64 ounces of fluid daily, avoid caffeinated beverages.

## 2023-10-23 NOTE — Assessment & Plan Note (Signed)
 Managed with Synthroid  150 mcg daily.

## 2023-10-23 NOTE — Progress Notes (Unsigned)
 Subjective:  Patient ID: Ashley Colon, female    DOB: Aug 12, 1960  Age: 63 y.o. MRN: [id number]  Chief Complaint  Patient presents with   Medical Management of Chronic Issues   Discussed the use of AI scribe software for clinical note transcription with the patient, who gave verbal consent to proceed.  History of Present Illness   Ashley Colon is a 63 year old female with a history of stroke and attention deficit disorder who presents for a regular follow-up visit.  Cerebrovascular disease and neurological symptoms - History of stroke; currently taking baby aspirin for secondary prevention - Speech has improved; sometimes struggles with repeating sounds and needs to slow down speech - Speech otherwise sounds fairly normal - Muscle cramps and weakness in legs, frequent and bothersome - Sensation of muscles 'vibrating' or having 'fasciculations' in legs - Afraid of falling, especially since January - No regular exercise due to leg pain and weakness - Enjoys hiking but avoids it due to fear of falling - No fevers, chills, sweats, earaches, sore throat, stuffy nose, or belly pain  Attention deficit disorder - Currently taking Strattera  40 mg daily - Misses previous medication, Adderall  Dyslipidemia and statin intolerance - History of muscle pain with atorvastatin , which has been discontinued - Currently taking Zetia  and Vascepa  for cholesterol management  Anemia - History of anemia, which has improved  Prediabetes and weight management - Last hemoglobin A1c was 6.1, consistent with prediabetes - Follows a healthy diet with three meals daily, including fruits, vegetables, and protein; avoids fried foods - Difficulty losing weight gained after stroke despite healthy eating habits  Physical activity and mobility - Not exercising regularly due to leg pain and weakness - Walks approximately eight hours a day at work - No regular walking partner; son is home from college but  often busy  Hydration and dietary habits - Drinks herbal teas due to dislike of water - Attempts to maintain adequate fluid intake - Takes a bariatric multivitamin and vitamin D  weekly  Allergies and mood - Has allergies - No depressive symptoms    History of stroke: on aspirin 81 mg daily.       10/23/2023    8:47 AM 07/18/2023    8:15 AM 05/17/2023    2:51 PM 04/17/2023   10:30 AM 01/17/2023    8:10 AM  Depression screen PHQ 2/9  Decreased Interest 0 0 0 0 0  Down, Depressed, Hopeless 0 0 0 0 0  PHQ - 2 Score 0 0 0 0 0  Altered sleeping 0      Tired, decreased energy 1      Change in appetite 0      Feeling bad or failure about yourself  0      Trouble concentrating 0      Moving slowly or fidgety/restless 0      Suicidal thoughts 0      PHQ-9 Score 1      Difficult doing work/chores Somewhat difficult            05/17/2023    2:51 PM  Fall Risk   Falls in the past year? 0  Number falls in past yr: 0  Injury with Fall? 0  Risk for fall due to : No Fall Risks    Patient Care Team: Sherre Clapper, MD as PCP - General (Family Medicine)   Review of Systems  Constitutional:  Negative for chills, fatigue and fever.  HENT:  Negative for congestion, ear pain,  rhinorrhea and sore throat.   Respiratory:  Negative for cough and shortness of breath.   Cardiovascular:  Negative for chest pain.  Gastrointestinal:  Negative for abdominal pain, constipation, diarrhea, nausea and vomiting.  Genitourinary:  Negative for dysuria and urgency.  Musculoskeletal:  Negative for back pain and myalgias.  Neurological:  Negative for dizziness, weakness, light-headedness and headaches.  Psychiatric/Behavioral:  Negative for dysphoric mood. The patient is not nervous/anxious.     Current Outpatient Medications on File Prior to Visit  Medication Sig Dispense Refill   aspirin EC 81 MG tablet Take 81 mg by mouth daily. Swallow whole.     ezetimibe  (ZETIA ) 10 MG tablet Take 1 tablet (10 mg  total) by mouth daily. 90 tablet 0   icosapent  Ethyl (VASCEPA ) 1 g capsule Take 1 capsule by mouth twice daily 180 capsule 0   levothyroxine  (SYNTHROID ) 150 MCG tablet TAKE 1 TABLET BY MOUTH ONCE DAILY BEFORE BREAKFAST 90 tablet 0   losartan  (COZAAR ) 100 MG tablet Take 1 tablet by mouth once daily 90 tablet 1   metoprolol  succinate (TOPROL -XL) 25 MG 24 hr tablet Take 1 tablet by mouth once daily 90 tablet 0   Multiple Vitamin (MULTIVITAMIN) tablet Take 1 tablet by mouth 2 (two) times daily.     oxybutynin  (DITROPAN  XL) 15 MG 24 hr tablet Take 1 tablet by mouth once daily 90 tablet 0   triamcinolone  (KENALOG ) 0.025 % ointment Apply 1 application. topically 2 (two) times daily. 30 g 0   Vitamin D , Ergocalciferol , (DRISDOL ) 1.25 MG (50000 UNIT) CAPS capsule Take 1 capsule by mouth once a week 12 capsule 0   No current facility-administered medications on file prior to visit.   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   Hemiparesis of right dominant side as late effect of cerebral infarction (HCC) 04/29/2023   Other iron deficiency anemias 04/28/2019   Overactive bladder 04/28/2019   Past Surgical History:  Procedure Laterality Date   CESAREAN SECTION  1999   CHOLECYSTECTOMY  1990   GASTRIC BYPASS  2008    Family History  Problem Relation Age of Onset   Stroke Mother    Pulmonary embolism Mother    Arthritis/Rheumatoid Mother    Coronary artery disease Father    Diverticulitis Father    Arthritis Father    Breast cancer Maternal Aunt    Lung cancer Maternal Grandfather    Stroke Maternal Grandmother    Social History   Socioeconomic History   Marital status: Married    Spouse name: Not on file   Number of children: 1   Years of education: Not on file   Highest education level: Not on file  Occupational History   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, nature  Tobacco Use    Smoking status: Never   Smokeless tobacco: Never  Vaping Use   Vaping status: Never Used  Substance and Sexual Activity   Alcohol use: Yes    Comment: rare   Drug use: Never   Sexual activity: Not Currently  Other Topics Concern   Not on file  Social History Narrative   Not on file   Social Drivers of Health   Financial Resource Strain: Low Risk  (01/17/2023)   Overall Financial Resource Strain (CARDIA)    Difficulty of Paying Living Expenses: Not very hard  Food Insecurity: No Food Insecurity (07/18/2023)   Hunger Vital Sign    Worried  About Running Out of Food in the Last Year: Never true    Ran Out of Food in the Last Year: Never true  Transportation Needs: No Transportation Needs (07/18/2023)   PRAPARE - Administrator, Civil Service (Medical): No    Lack of Transportation (Non-Medical): No  Physical Activity: Sufficiently Active (01/17/2023)   Exercise Vital Sign    Days of Exercise per Week: 5 days    Minutes of Exercise per Session: 40 min  Stress: No Stress Concern Present (01/17/2023)   [employer] of Occupational Health - Occupational Stress Questionnaire    Feeling of Stress : Only a little  Social Connections: Moderately Isolated (01/17/2023)   Social Connection and Isolation Panel    Frequency of Communication with Friends and Family: More than three times a week    Frequency of Social Gatherings with Friends and Family: More than three times a week    Attends Religious Services: Never    Database administrator or Organizations: No    Attends Engineer, structural: Never    Marital Status: Married    Objective:  BP 122/80   Pulse 81   Temp 98.4 F (36.9 C)   Ht 5' 1.5 (1.562 m)   Wt 212 lb (96.2 kg)   SpO2 97%   BMI 39.41 kg/m      10/23/2023    8:40 AM 07/18/2023    8:03 AM 05/17/2023    2:49 PM  BP/Weight  Systolic BP 122 136 124  Diastolic BP 80 88 68  Wt. (Lbs) 212 212 210.8  BMI 39.41 kg/m2 39.41 kg/m2 39.19  kg/m2    Physical Exam Vitals reviewed.  Constitutional:      Appearance: Normal appearance. She is obese.  Neck:     Vascular: No carotid bruit.  Cardiovascular:     Rate and Rhythm: Normal rate and regular rhythm.     Heart sounds: Normal heart sounds.  Pulmonary:     Effort: Pulmonary effort is normal. No respiratory distress.     Breath sounds: Normal breath sounds.  Abdominal:     General: Abdomen is flat. Bowel sounds are normal.     Palpations: Abdomen is soft.     Tenderness: There is no abdominal tenderness.  Musculoskeletal:     Right upper leg: Normal.     Left upper leg: Normal.     Right lower leg: Normal.     Left lower leg: Normal.  Neurological:     Mental Status: She is alert and oriented to person, place, and time.  Psychiatric:        Mood and Affect: Mood normal.        Behavior: Behavior normal.         Lab Results  Component Value Date   WBC 7.2 10/23/2023   HGB 13.6 10/23/2023   HCT 41.1 10/23/2023   PLT 294 10/23/2023   GLUCOSE 110 (H) 10/23/2023   CHOL 146 10/23/2023   TRIG 203 (H) 10/23/2023   HDL 32 (L) 10/23/2023   LDLCALC 80 10/23/2023   ALT 32 10/23/2023   AST 29 10/23/2023   NA 142 10/23/2023   K 4.7 10/23/2023   CL 102 10/23/2023   CREATININE 0.77 10/23/2023   BUN 13 10/23/2023   CO2 23 10/23/2023   TSH 4.260 04/17/2023   HGBA1C 5.8 (H) 10/23/2023      Assessment & Plan:  Attention deficit hyperactivity disorder (ADHD), predominantly inattentive type Assessment & Plan: The  current medical regimen is effective;  continue present plan and medications. Increase strattera  to 80 mg daily.   Orders: -     Atomoxetine  HCl; Take 1 capsule (80 mg total) by mouth daily.  Dispense: 90 capsule; Refill: 1  History of stroke Assessment & Plan: Speech improved, difficulty with sound repetition. Continues on baby aspirin for prevention.   Mixed hyperlipidemia Assessment & Plan: Managed with Zetia  and Vascepa . Previously  elevated triglycerides. Discussed dietary modifications.  Orders: -     Lipid panel  Impaired fasting glucose Assessment & Plan: A1c was 6.1. Discussed dietary habits and weight management post-stroke. - Order repeat A1c test.  Orders: -     Hemoglobin A1c  Muscle cramps Assessment & Plan: Persistent cramps and weakness with fasciculations. Normal magnesium and muscle protein levels. No medication link identified. - Ensure hydration with 64 ounces of fluid daily, avoid caffeinated beverages.   Essential hypertension Assessment & Plan: Well controlled.  No changes to medicines. Continue toprol  xl 25 mg daily, Losartan  100 mg daily. Continue to work on eating a healthy diet and exercise.  Labs drawn today.    Orders: -     Comprehensive metabolic panel with GFR -     CBC with Differential/Platelet  Morbid obesity (HCC) Assessment & Plan: Difficulty losing weight post-stroke. Engages in healthy eating, avoids fried foods.   Hypothyroidism due to acquired atrophy of thyroid Assessment & Plan: Managed with Synthroid  150 mcg daily.   SVT (supraventricular tachycardia) (HCC) Assessment & Plan: Managed with metoprolol  XL 25 mg daily.   Vitamin D  insufficiency Assessment & Plan: Managed with weekly vitamin D  supplementation.   Visit for screening mammogram -     Digital Screening Mammogram, Left and Right; Future    Meds ordered this encounter  Medications   atomoxetine  (STRATTERA ) 80 MG capsule    Sig: Take 1 capsule (80 mg total) by mouth daily.    Dispense:  90 capsule    Refill:  1    Orders Placed This Encounter  Procedures   MM DIGITAL SCREENING BILATERAL   Lipid panel   Hemoglobin A1c   Comprehensive metabolic panel with GFR   CBC with Differential/Platelet     Follow-up: Return in about 4 months (around 02/23/2024) for chronic follow up.   I,Katherina A Bramblett,acting as a scribe for Abigail Free, MD.,have documented all relevant  documentation on the behalf of Abigail Free, MD,as directed by  Abigail Free, MD while in the presence of Abigail Free, MD.   LILLETTE Kato I Leal-Borjas,acting as a scribe for Abigail Free, MD.,have documented all relevant documentation on the behalf of Abigail Free, MD,as directed by  Abigail Free, MD while in the presence of Abigail Free, MD.    An After Visit Summary was printed and given to the patient.  Abigail Free, MD [facility] [phone]

## 2023-10-23 NOTE — Assessment & Plan Note (Signed)
 Managed with Zetia  and Vascepa . Previously elevated triglycerides. Discussed dietary modifications.

## 2023-10-23 NOTE — Assessment & Plan Note (Signed)
 The current medical regimen is effective;  continue present plan and medications. Increase strattera  to 80 mg daily.

## 2023-10-23 NOTE — Assessment & Plan Note (Signed)
 Difficulty losing weight post-stroke. Engages in healthy eating, avoids fried foods.

## 2023-10-23 NOTE — Assessment & Plan Note (Signed)
 Speech improved, difficulty with sound repetition. Continues on baby aspirin for prevention.

## 2023-10-23 NOTE — Assessment & Plan Note (Signed)
 Managed with weekly vitamin D  supplementation.

## 2023-10-23 NOTE — Assessment & Plan Note (Signed)
 A1c was 6.1. Discussed dietary habits and weight management post-stroke. - Order repeat A1c test.

## 2023-10-23 NOTE — Assessment & Plan Note (Deleted)
 Difficulty losing weight post-stroke. Engages in healthy eating, avoids fried foods.

## 2023-10-23 NOTE — Assessment & Plan Note (Signed)
 Managed with metoprolol  XL 25 mg daily.

## 2023-10-24 ENCOUNTER — Ambulatory Visit: Payer: Self-pay | Admitting: Family Medicine

## 2023-10-24 DIAGNOSIS — E782 Mixed hyperlipidemia: Secondary | ICD-10-CM

## 2023-10-24 LAB — LIPID PANEL
Chol/HDL Ratio: 4.6 ratio — ABNORMAL HIGH (ref 0.0–4.4)
Cholesterol, Total: 146 mg/dL (ref 100–199)
HDL: 32 mg/dL — ABNORMAL LOW (ref >39)
LDL Chol Calc (NIH): 80 mg/dL (ref 0–99)
Triglycerides: 203 mg/dL — ABNORMAL HIGH (ref 0–149)
VLDL Cholesterol Cal: 34 mg/dL (ref 5–40)

## 2023-10-24 LAB — CBC WITH DIFFERENTIAL/PLATELET
Basophils Absolute: 0.1 x10E3/uL (ref 0.0–0.2)
Basos: 1 %
EOS (ABSOLUTE): 0.4 x10E3/uL (ref 0.0–0.4)
Eos: 6 %
Hematocrit: 41.1 % (ref 34.0–46.6)
Hemoglobin: 13.6 g/dL (ref 11.1–15.9)
Immature Grans (Abs): 0 x10E3/uL (ref 0.0–0.1)
Immature Granulocytes: 0 %
Lymphocytes Absolute: 2.6 x10E3/uL (ref 0.7–3.1)
Lymphs: 36 %
MCH: 29.4 pg (ref 26.6–33.0)
MCHC: 33.1 g/dL (ref 31.5–35.7)
MCV: 89 fL (ref 79–97)
Monocytes Absolute: 0.5 x10E3/uL (ref 0.1–0.9)
Monocytes: 7 %
Neutrophils Absolute: 3.7 x10E3/uL (ref 1.4–7.0)
Neutrophils: 50 %
Platelets: 294 x10E3/uL (ref 150–450)
RBC: 4.63 x10E6/uL (ref 3.77–5.28)
RDW: 13.9 % (ref 11.7–15.4)
WBC: 7.2 x10E3/uL (ref 3.4–10.8)

## 2023-10-24 LAB — COMPREHENSIVE METABOLIC PANEL WITH GFR
ALT: 32 IU/L (ref 0–32)
AST: 29 IU/L (ref 0–40)
Albumin: 4.2 g/dL (ref 3.8–4.9)
Alkaline Phosphatase: 88 IU/L (ref 44–121)
BUN/Creatinine Ratio: 17 (ref 12–28)
BUN: 13 mg/dL (ref 8–27)
Bilirubin Total: 0.6 mg/dL (ref 0.0–1.2)
CO2: 23 mmol/L (ref 20–29)
Calcium: 9.4 mg/dL (ref 8.7–10.3)
Chloride: 102 mmol/L (ref 96–106)
Creatinine, Ser: 0.77 mg/dL (ref 0.57–1.00)
Globulin, Total: 2.4 g/dL (ref 1.5–4.5)
Glucose: 110 mg/dL — ABNORMAL HIGH (ref 70–99)
Potassium: 4.7 mmol/L (ref 3.5–5.2)
Sodium: 142 mmol/L (ref 134–144)
Total Protein: 6.6 g/dL (ref 6.0–8.5)
eGFR: 88 mL/min/1.73 (ref >59)

## 2023-10-24 LAB — HEMOGLOBIN A1C
Est. average glucose Bld gHb Est-mCnc: 120 mg/dL
Hgb A1c MFr Bld: 5.8 % — ABNORMAL HIGH (ref 4.8–5.6)

## 2023-10-25 MED ORDER — FENOFIBRATE 160 MG PO TABS
160.0000 mg | ORAL_TABLET | Freq: Every day | ORAL | 0 refills | Status: DC
Start: 2023-10-25 — End: 2024-01-17

## 2023-11-02 ENCOUNTER — Other Ambulatory Visit: Payer: Self-pay | Admitting: Family Medicine

## 2023-11-07 ENCOUNTER — Other Ambulatory Visit (HOSPITAL_COMMUNITY)
Admission: RE | Admit: 2023-11-07 | Discharge: 2023-11-07 | Disposition: A | Discharge status: Home / Self Care | Payer: Self-pay | Source: Ambulatory Visit | Attending: Medical Genetics | Admitting: Medical Genetics

## 2023-11-17 ENCOUNTER — Other Ambulatory Visit: Payer: Self-pay | Admitting: Family Medicine

## 2023-11-17 LAB — GENECONNECT MOLECULAR SCREEN: Genetic Analysis Overall Interpretation: NEGATIVE

## 2023-11-18 ENCOUNTER — Other Ambulatory Visit: Payer: Self-pay

## 2023-11-18 DIAGNOSIS — N3941 Urge incontinence: Secondary | ICD-10-CM

## 2023-11-20 ENCOUNTER — Other Ambulatory Visit: Payer: Self-pay

## 2023-11-20 DIAGNOSIS — N3941 Urge incontinence: Secondary | ICD-10-CM

## 2023-12-08 ENCOUNTER — Other Ambulatory Visit: Payer: Self-pay | Admitting: Family Medicine

## 2023-12-08 DIAGNOSIS — E559 Vitamin D deficiency, unspecified: Secondary | ICD-10-CM

## 2023-12-12 ENCOUNTER — Ambulatory Visit
Admission: RE | Admit: 2023-12-12 | Discharge: 2023-12-12 | Disposition: A | Discharge status: Home / Self Care | Source: Ambulatory Visit | Attending: Family Medicine | Admitting: Family Medicine

## 2023-12-12 DIAGNOSIS — Z1231 Encounter for screening mammogram for malignant neoplasm of breast: Secondary | ICD-10-CM

## 2023-12-13 ENCOUNTER — Other Ambulatory Visit: Payer: Self-pay | Admitting: Family Medicine

## 2023-12-21 ENCOUNTER — Other Ambulatory Visit: Payer: Self-pay | Admitting: Family Medicine

## 2024-01-17 ENCOUNTER — Other Ambulatory Visit: Payer: Self-pay | Admitting: Family Medicine

## 2024-01-17 DIAGNOSIS — E782 Mixed hyperlipidemia: Secondary | ICD-10-CM

## 2024-01-29 IMAGING — MG MM DIGITAL SCREENING BILAT W/ TOMO AND CAD
8 series · 8 of 24 positions shown · non-contrast
Comparison: Previous exam(s).

CLINICAL DATA: Screening.

EXAM:
DIGITAL SCREENING BILATERAL MAMMOGRAM WITH TOMOSYNTHESIS AND CAD
TECHNIQUE: Bilateral screening digital craniocaudal and mediolateral oblique
mammograms were obtained. Bilateral screening digital breast
tomosynthesis was performed. The images were evaluated with
computer-aided detection.

[R MLO synth-2D]
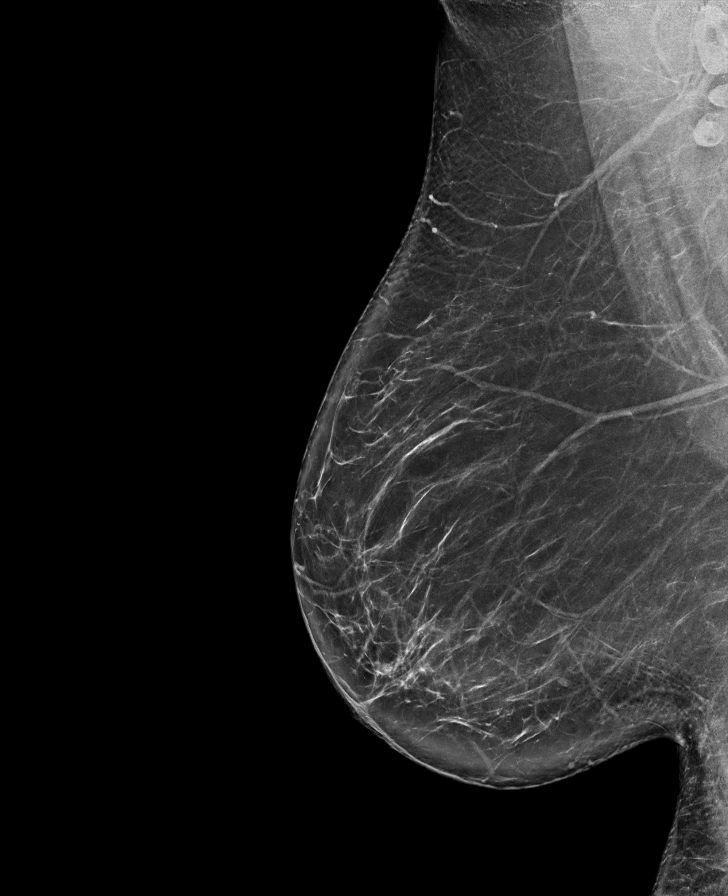

[R CC synth-2D]
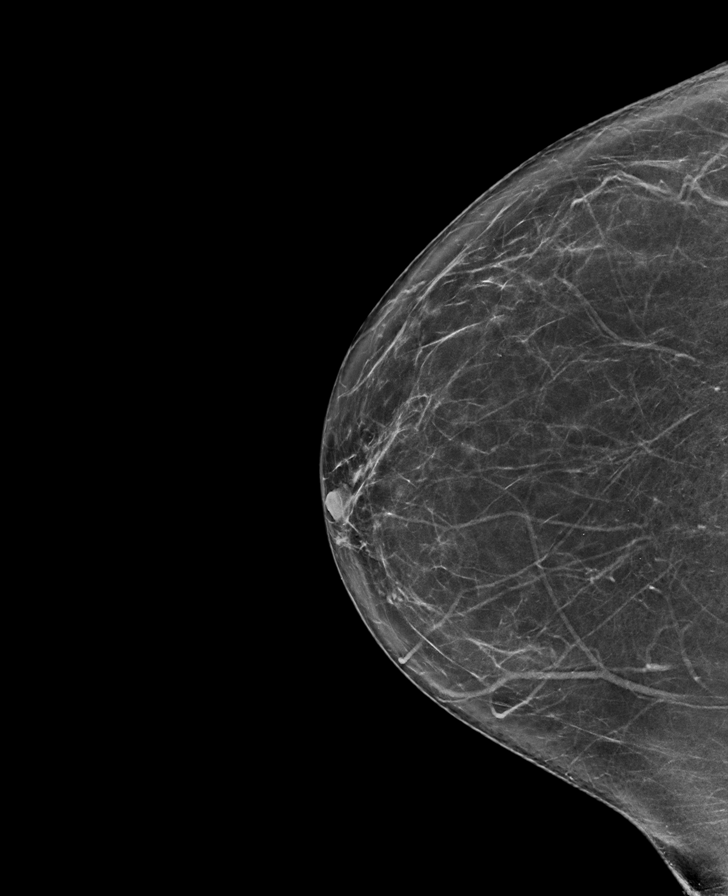

[L CC synth-2D]
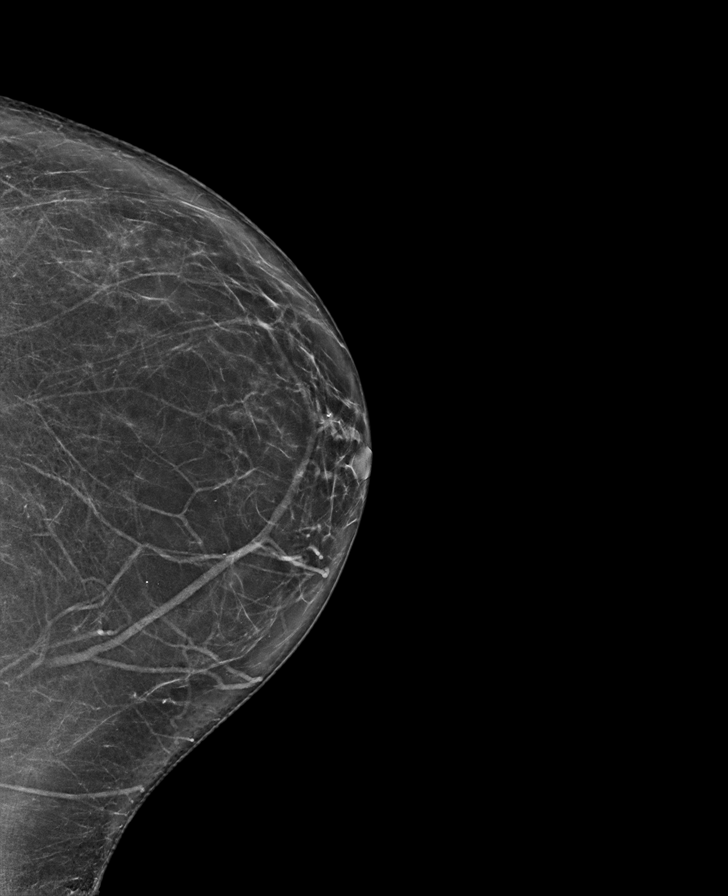

[L MLO synth-2D]
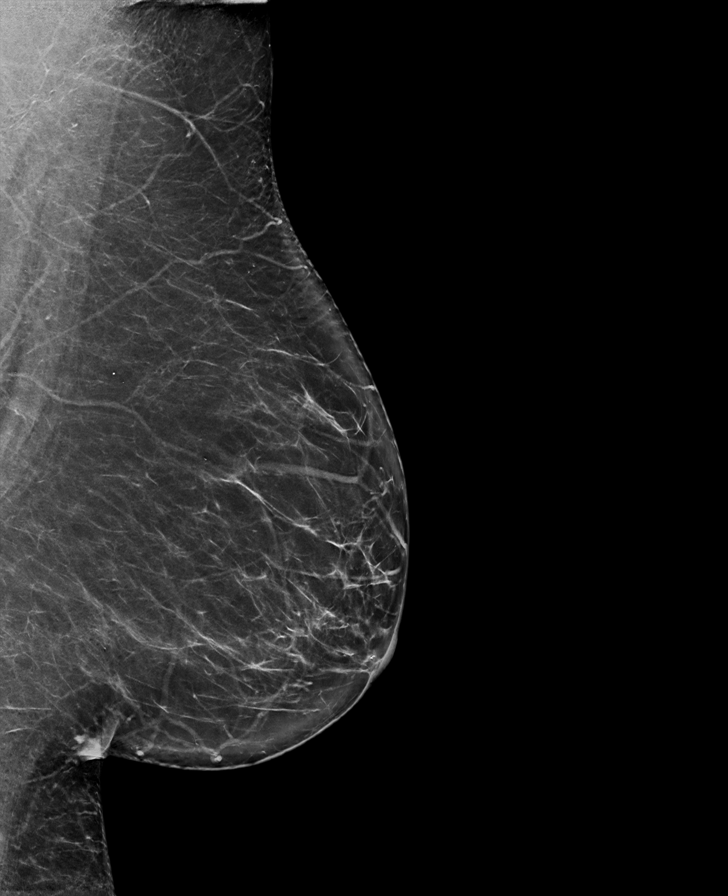

[L MLO tomo · tomo slice 43/85.0]
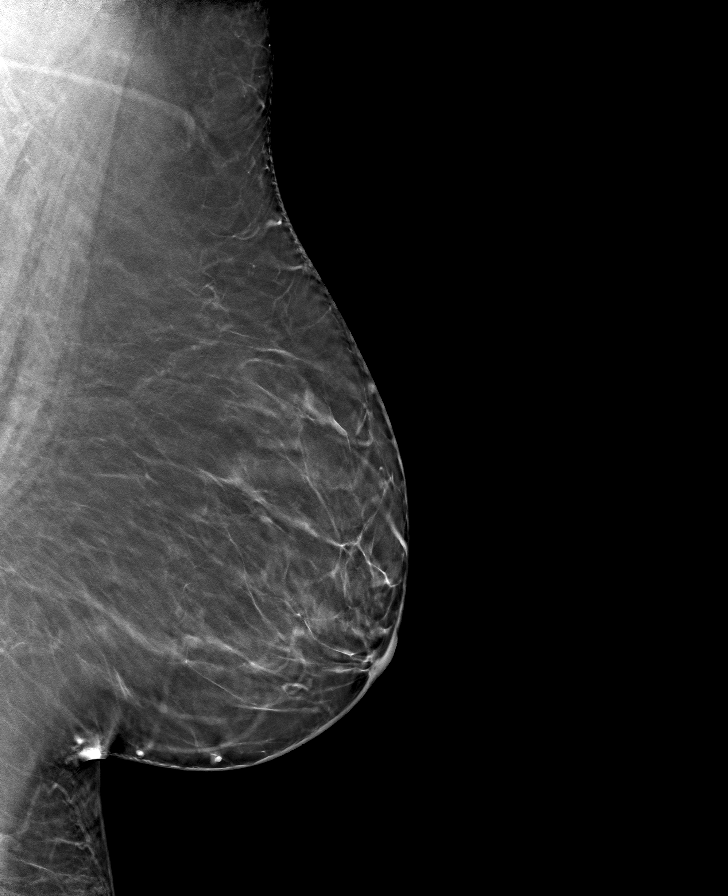

[R CC tomo · tomo slice 39/76.0]
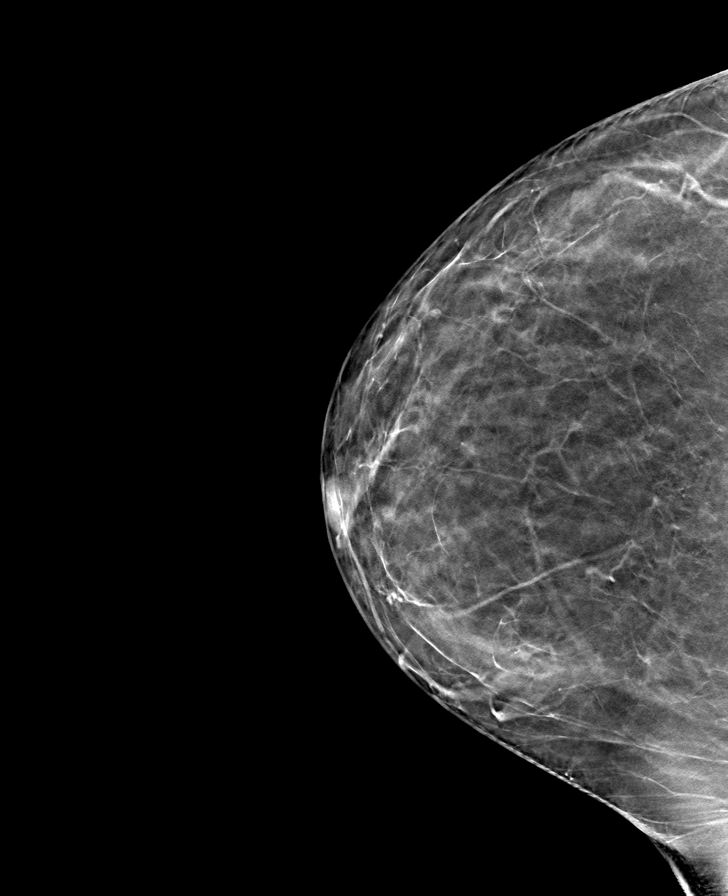

[R MLO tomo · tomo slice 45/90.0]
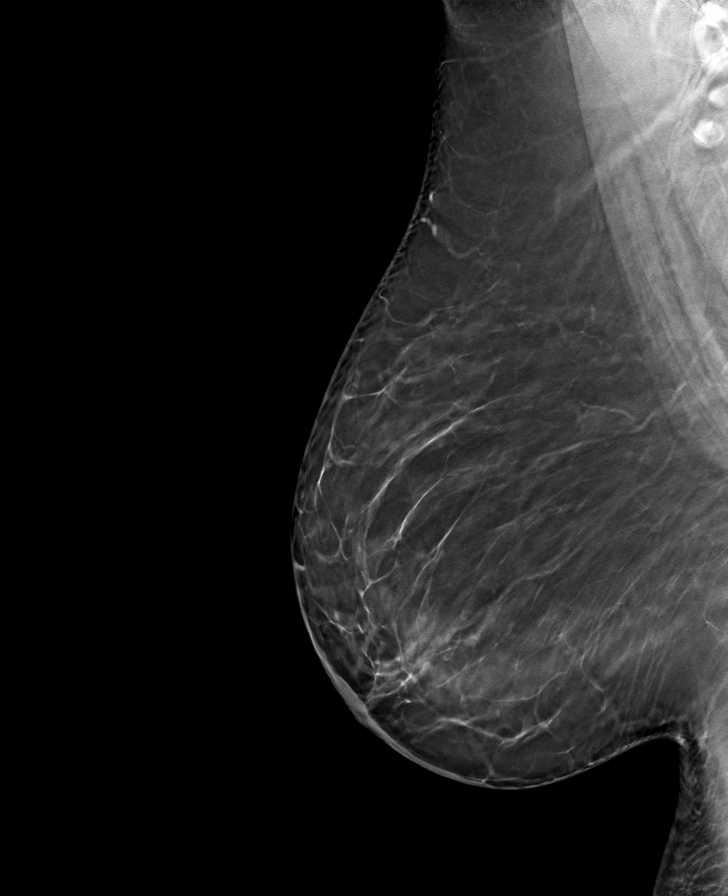

[L CC tomo · tomo slice 39/77.0]
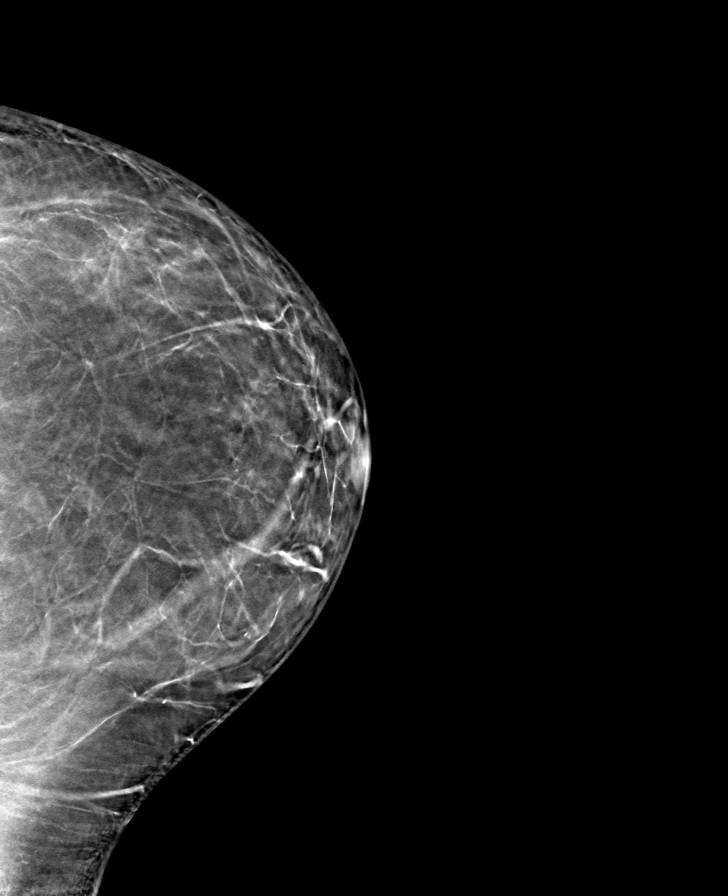

[8 of 24 positions shown; findings below may reference images not displayed]

ACR Breast Density Category b: There are scattered areas of
fibroglandular density.
FINDINGS: There are no findings suspicious for malignancy.
IMPRESSION: No mammographic evidence of malignancy. A result letter of this
screening mammogram will be mailed directly to the patient.

RECOMMENDATION:
Screening mammogram in one year. (Code:51-O-LD2)

BI-RADS CATEGORY  1: Negative.

## 2024-01-30 ENCOUNTER — Other Ambulatory Visit: Payer: Self-pay | Admitting: Family Medicine

## 2024-02-26 ENCOUNTER — Encounter: Payer: Self-pay | Admitting: Family Medicine

## 2024-02-26 ENCOUNTER — Ambulatory Visit: Admitting: Family Medicine

## 2024-02-26 VITALS — BP 164/88 | HR 79 | Temp 97.5°F | Resp 16 | Ht 61.5 in | Wt 206.0 lb

## 2024-02-26 DIAGNOSIS — E034 Atrophy of thyroid (acquired): Secondary | ICD-10-CM

## 2024-02-26 DIAGNOSIS — F900 Attention-deficit hyperactivity disorder, predominantly inattentive type: Secondary | ICD-10-CM

## 2024-02-26 DIAGNOSIS — Z23 Encounter for immunization: Secondary | ICD-10-CM | POA: Diagnosis not present

## 2024-02-26 DIAGNOSIS — E782 Mixed hyperlipidemia: Secondary | ICD-10-CM

## 2024-02-26 DIAGNOSIS — I10 Essential (primary) hypertension: Secondary | ICD-10-CM | POA: Diagnosis not present

## 2024-02-26 DIAGNOSIS — R7301 Impaired fasting glucose: Secondary | ICD-10-CM | POA: Diagnosis not present

## 2024-02-26 DIAGNOSIS — J301 Allergic rhinitis due to pollen: Secondary | ICD-10-CM

## 2024-02-26 DIAGNOSIS — I471 Supraventricular tachycardia, unspecified: Secondary | ICD-10-CM

## 2024-02-26 DIAGNOSIS — G5601 Carpal tunnel syndrome, right upper limb: Secondary | ICD-10-CM

## 2024-02-26 LAB — POCT LIPID PANEL
HDL: 35
LDL/HDL Ratio: 2.2
LDL: 77
Non-HDL: 99
TC: 134
TRG: 108

## 2024-02-26 LAB — POCT GLYCOSYLATED HEMOGLOBIN (HGB A1C): HbA1c POC (<> result, manual entry): 5.9 % (ref 4.0–5.6)

## 2024-02-26 NOTE — Patient Instructions (Addendum)
  VISIT SUMMARY: Today, we reviewed your hypertension, hyperlipidemia, prediabetes, thyroid function, carpal tunnel syndrome, attention deficit symptoms, and allergic rhinitis. We also administered your flu and pneumonia vaccinations.  YOUR PLAN: ESSENTIAL HYPERTENSION: Your blood pressure was high today, likely because you didn't take your medications this morning. -Recheck your blood pressure after taking your medication. -We will follow up to re-evaluate your blood pressure. -We may adjust your medications if your blood pressure remains high.  MIXED HYPERLIPIDEMIA: Your cholesterol levels have improved with your current treatment. -Continue taking your current lipid-lowering medications.  IMPAIRED FASTING GLUCOSE: Your A1c level has slightly increased, indicating prediabetes, but you have had positive results from lifestyle changes. -Continue with your current lifestyle modifications, including your diet and exercise.  ACQUIRED HYPOTHYROIDISM: Your thyroid function is managed with levothyroxine . -Continue taking levothyroxine  150 mcg daily. -We have ordered thyroid function tests to monitor your condition.  CARPAL TUNNEL SYNDROME AND TRIGGER FINGER, RIGHT HAND: You have tingling and numbness in your right hand, and you are not consistently using your wrist braces. -Use your wrist braces, especially at night. -We have scheduled a steroid injection for your carpal tunnel syndrome.  ATTENTION DEFICIT DISORDER: You may be experiencing side effects with Strattera . -Consider trying Qelbree if Strattera  continues to be ineffective. -Hold strattera  to see if causing side effects.   ALLERGIC RHINITIS: You have a stuffy nose due to allergies. -Continue taking Zyrtec for your allergies.  GENERAL HEALTH MAINTENANCE: We discussed and agreed on flu and pneumonia vaccinations. -You received your flu shot today. -You received your pneumonia shot  today.                      Contains text generated by [employer]bridge.                                 Contains text generated by [employer]bridge.

## 2024-02-26 NOTE — Assessment & Plan Note (Addendum)
 A1c increased to 5.9%, indicating prediabetes. Positive results from lifestyle modifications. - Continue lifestyle modifications. Orders:   POCT glycosylated hemoglobin (Hb A1C)

## 2024-02-26 NOTE — Assessment & Plan Note (Addendum)
 Cholesterol levels improved with current treatment. Statins not tolerated due to muscle pain. - Continue current lipid-lowering medications. Orders:   POCT Lipid Panel   Comprehensive metabolic panel with GFR   CBC with Differential/Platelet

## 2024-02-26 NOTE — Assessment & Plan Note (Addendum)
 Ashley Colon

## 2024-02-26 NOTE — Progress Notes (Signed)
 Subjective:  Patient ID: Ashley Colon, female    DOB: 07-Sep-1960  Age: 63 y.o. MRN: [id number]  Chief Complaint  Patient presents with   Medical Management of Chronic Issues    HPI: Discussed the use of AI scribe software for clinical note transcription with the patient, who gave verbal consent to proceed.  History of Present Illness Ashley Colon is a 63 year old female with hypertension and hyperlipidemia who presents for a follow-up visit.  Hypertension - Blood pressure recently measured at 200/100 mmHg - Takes metoprolol  XL 25 mg once daily and losartan  100 mg once daily - Did not take antihypertensive medications today due to fasting for blood work - Experiences nausea and vomiting when taking antihypertensive medications without food - Does not monitor blood pressure at home due to lack of a cuff  Hyperlipidemia - Significant improvement in lipid profile: total cholesterol decreased from 146 to 134 mg/dL, triglycerides from 796 to 108 mg/dL, LDL from 80 to 77 mg/dL, HDL increased from 30 to 35 mg/dL - Currently takes Zetia  10 mg daily, fenofibrate  160 mg daily, and fish oil 1 gram twice a day - Previously experienced significant muscle pain with statins (Lipitor, Crestor ) and is not on statin therapy - Attributes improvement in triglycerides to reduced sugar intake and better dietary understanding  Prediabetes and weight management - Recent A1c of 5.9%, slightly increased from 5.8% - Manages condition through dietary changes, including reduced carbohydrate intake - Weight loss of six pounds - Exercises regularly, though less than desired  Thyroid dysfunction - Takes levothyroxine  150 mcg daily for thyroid management  Attention deficit symptoms - Takes Strattera  for attention issues - Finds Strattera  less effective than Adderall but tolerates due to fewer cardiovascular side effects  Carpal tunnel syndrome and trigger finger - History of carpal tunnel syndrome and  trigger finger - Previous steroid injections provided relief - Tingling and numbness in right hand - Compensatory pain in left hand - Uses wrist braces at night for symptom relief  Seasonal affective symptoms - Experiences melancholy and seasonal affective symptoms, particularly in December around the anniversary of her mother's and grandmother's deaths - Has a history of donating blood during this time as a form of remembrance  Allergic rhinitis - Takes Zyrtec for allergies - Experiences stuffy nose - No recent fevers, chills, earaches, sore throat, chest pain, or trouble breathing  Headache and speech difficulties - Experiences headaches attributed to lack of caffeine - Occasional speech difficulties when tired  Supplement use - Takes vitamin D  once a week, a multivitamin, and aspirin 81 mg daily       02/26/2024    8:40 AM 10/23/2023    8:47 AM 07/18/2023    8:15 AM 05/17/2023    2:51 PM 04/17/2023   10:30 AM  Depression screen PHQ 2/9  Decreased Interest 1 0 0 0 0  Down, Depressed, Hopeless 1 0 0 0 0  PHQ - 2 Score 2 0 0 0 0  Altered sleeping 0 0     Tired, decreased energy 2 1     Change in appetite 0 0     Feeling bad or failure about yourself  0 0     Trouble concentrating 0 0     Moving slowly or fidgety/restless 0 0     Suicidal thoughts 0 0     PHQ-9 Score 4 1      Difficult doing work/chores Somewhat difficult Somewhat difficult        Data  saved with a previous flowsheet row definition        02/26/2024    8:40 AM  Fall Risk   Falls in the past year? 0  Number falls in past yr: 0  Injury with Fall? 0  Risk for fall due to : No Fall Risks  Follow up Falls evaluation completed    Patient Care Team: Sherre Clapper, MD as PCP - General (Family Medicine)   Review of Systems  Constitutional:  Negative for chills, fatigue and fever.  HENT:  Negative for congestion, ear pain and sore throat.   Eyes:  Negative for visual disturbance.  Respiratory:  Negative  for cough and shortness of breath.   Cardiovascular:  Negative for chest pain and palpitations.  Gastrointestinal:  Negative for abdominal pain, constipation, diarrhea, nausea and vomiting.  Endocrine: Negative for polydipsia, polyphagia and polyuria.  Genitourinary:  Negative for difficulty urinating and dysuria.  Musculoskeletal:  Negative for arthralgias, back pain and myalgias.  Skin:  Negative for rash.  Neurological:  Positive for headaches.  Psychiatric/Behavioral:  Negative for dysphoric mood. The patient is not nervous/anxious.     Current Outpatient Medications on File Prior to Visit  Medication Sig Dispense Refill   aspirin EC 81 MG tablet Take 81 mg by mouth daily. Swallow whole.     atomoxetine  (STRATTERA ) 80 MG capsule Take 1 capsule (80 mg total) by mouth daily. 90 capsule 1   ezetimibe  (ZETIA ) 10 MG tablet Take 1 tablet by mouth once daily 90 tablet 0   fenofibrate  160 MG tablet Take 1 tablet by mouth once daily 90 tablet 0   icosapent  Ethyl (VASCEPA ) 1 g capsule Take 1 capsule by mouth twice daily 180 capsule 0   levothyroxine  (SYNTHROID ) 150 MCG tablet TAKE 1 TABLET BY MOUTH ONCE DAILY BEFORE BREAKFAST 90 tablet 1   losartan  (COZAAR ) 100 MG tablet Take 1 tablet by mouth once daily 90 tablet 1   Multiple Vitamin (MULTIVITAMIN) tablet Take 1 tablet by mouth 2 (two) times daily.     oxybutynin  (DITROPAN  XL) 15 MG 24 hr tablet Take 1 tablet by mouth once daily 90 tablet 0   Vitamin D , Ergocalciferol , (DRISDOL ) 1.25 MG (50000 UNIT) CAPS capsule Take 1 capsule by mouth once a week 12 capsule 3   No current facility-administered medications on file prior to visit.   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   Hemiparesis of right dominant side as late effect of cerebral infarction (HCC) 04/29/2023   Other iron deficiency anemias 04/28/2019   Overactive  bladder 04/28/2019   Past Surgical History:  Procedure Laterality Date   CESAREAN SECTION  1999   CHOLECYSTECTOMY  1990   GASTRIC BYPASS  2008    Family History  Problem Relation Age of Onset   Stroke Mother    Pulmonary embolism Mother    Arthritis/Rheumatoid Mother    Coronary artery disease Father    Diverticulitis Father    Arthritis Father    Breast cancer Maternal Aunt    Lung cancer Maternal Grandfather    Stroke Maternal Grandmother    Social History   Socioeconomic History   Marital status: Married    Spouse name: Not on file   Number of children: 1   Years of education: Not on file   Highest education level: Not on file  Occupational History   Occupation: Technical Sales Engineer  Tobacco Use  Smoking status: Never   Smokeless tobacco: Never  Vaping Use   Vaping status: Never Used  Substance and Sexual Activity   Alcohol use: Yes    Comment: rare   Drug use: Never   Sexual activity: Not Currently  Other Topics Concern   Not on file  Social History Narrative   Not on file   Social Drivers of Health   Financial Resource Strain: Low Risk  (01/17/2023)   Overall Financial Resource Strain (CARDIA)    Difficulty of Paying Living Expenses: Not very hard  Food Insecurity: No Food Insecurity (07/18/2023)   Hunger Vital Sign    Worried About Running Out of Food in the Last Year: Never true    Ran Out of Food in the Last Year: Never true  Transportation Needs: No Transportation Needs (07/18/2023)   PRAPARE - Administrator, Civil Service (Medical): No    Lack of Transportation (Non-Medical): No  Physical Activity: Sufficiently Active (01/17/2023)   Exercise Vital Sign    Days of Exercise per Week: 5 days    Minutes of Exercise per Session: 40 min  Stress: No Stress Concern Present (01/17/2023)   [employer] of Occupational Health - Occupational Stress Questionnaire    Feeling of Stress : Only a little  Social Connections: Moderately Isolated  (01/17/2023)   Social Connection and Isolation Panel    Frequency of Communication with Friends and Family: More than three times a week    Frequency of Social Gatherings with Friends and Family: More than three times a week    Attends Religious Services: Never    Database Administrator or Organizations: No    Attends Engineer, Structural: Never    Marital Status: Married    Objective:  BP (!) 164/88   Pulse 79   Temp (!) 97.5 F (36.4 C)   Resp 16   Ht 5' 1.5 (1.562 m)   Wt 206 lb (93.4 kg)   SpO2 97%   BMI 38.29 kg/m      02/27/2024    1:31 PM 02/26/2024    8:59 AM 02/26/2024    8:25 AM  BP/Weight  Systolic BP 164 164 200  Diastolic BP 72 88 100  Wt. (Lbs)   206  BMI   38.29 kg/m2    Physical Exam Vitals reviewed.  Constitutional:      Appearance: Normal appearance. She is obese.  Neck:     Vascular: No carotid bruit.  Cardiovascular:     Rate and Rhythm: Normal rate and regular rhythm.     Heart sounds: Normal heart sounds.  Pulmonary:     Effort: Pulmonary effort is normal. No respiratory distress.     Breath sounds: Normal breath sounds.  Abdominal:     General: Abdomen is flat. Bowel sounds are normal.     Palpations: Abdomen is soft.     Tenderness: There is no abdominal tenderness.  Musculoskeletal:     Comments: Positive tinels and phalens on right hand.  Left hand normal.   Neurological:     Mental Status: She is alert and oriented to person, place, and time.  Psychiatric:        Mood and Affect: Mood normal.        Behavior: Behavior normal.         Lab Results  Component Value Date   WBC 7.4 02/26/2024   HGB 14.3 02/26/2024   HCT 43.4 02/26/2024   PLT 331 02/26/2024  GLUCOSE 109 (H) 02/26/2024   CHOL 146 10/23/2023   TRIG 203 (H) 10/23/2023   HDL 32 (L) 10/23/2023   LDLCALC 80 10/23/2023   ALT 43 (H) 02/26/2024   AST 43 (H) 02/26/2024   NA 143 02/26/2024   K 4.5 02/26/2024   CL 105 02/26/2024   CREATININE 0.79  02/26/2024   BUN 15 02/26/2024   CO2 20 02/26/2024   TSH 2.090 02/26/2024   HGBA1C 5.9 02/26/2024    Results for orders placed or performed in visit on 02/26/24  POCT Lipid Panel   Collection Time: 02/26/24  8:44 AM  Result Value Ref Range   TC 134    HDL 35    TRG 108    LDL 77    Non-HDL 99    TC/HDL     LDL/HDL Ratio 2.2   POCT glycosylated hemoglobin (Hb A1C)   Collection Time: 02/26/24  8:44 AM  Result Value Ref Range   Hemoglobin A1C     HbA1c POC (<> result, manual entry) 5.9 4.0 - 5.6 %   HbA1c, POC (prediabetic range)     HbA1c, POC (controlled diabetic range)    Comprehensive metabolic panel with GFR   Collection Time: 02/26/24  9:20 AM  Result Value Ref Range   Glucose 109 (H) 70 - 99 mg/dL   BUN 15 8 - 27 mg/dL   Creatinine, Ser 9.20 0.57 - 1.00 mg/dL   eGFR 85 >40 fO/fpw/8.26   BUN/Creatinine Ratio 19 12 - 28   Sodium 143 134 - 144 mmol/L   Potassium 4.5 3.5 - 5.2 mmol/L   Chloride 105 96 - 106 mmol/L   CO2 20 20 - 29 mmol/L   Calcium  9.6 8.7 - 10.3 mg/dL   Total Protein 7.0 6.0 - 8.5 g/dL   Albumin 4.5 3.9 - 4.9 g/dL   Globulin, Total 2.5 1.5 - 4.5 g/dL   Bilirubin Total 0.4 0.0 - 1.2 mg/dL   Alkaline Phosphatase 56 49 - 135 IU/L   AST 43 (H) 0 - 40 IU/L   ALT 43 (H) 0 - 32 IU/L  CBC with Differential/Platelet   Collection Time: 02/26/24  9:20 AM  Result Value Ref Range   WBC 7.4 3.4 - 10.8 x10E3/uL   RBC 4.88 3.77 - 5.28 x10E6/uL   Hemoglobin 14.3 11.1 - 15.9 g/dL   Hematocrit 56.5 65.9 - 46.6 %   MCV 89 79 - 97 fL   MCH 29.3 26.6 - 33.0 pg   MCHC 32.9 31.5 - 35.7 g/dL   RDW 86.9 88.2 - 84.5 %   Platelets 331 150 - 450 x10E3/uL   Neutrophils 55 Not Estab. %   Lymphs 33 Not Estab. %   Monocytes 7 Not Estab. %   Eos 4 Not Estab. %   Basos 1 Not Estab. %   Neutrophils Absolute 4.0 1.4 - 7.0 x10E3/uL   Lymphocytes Absolute 2.5 0.7 - 3.1 x10E3/uL   Monocytes Absolute 0.5 0.1 - 0.9 x10E3/uL   EOS (ABSOLUTE) 0.3 0.0 - 0.4 x10E3/uL   Basophils  Absolute 0.1 0.0 - 0.2 x10E3/uL   Immature Granulocytes 0 Not Estab. %   Immature Grans (Abs) 0.0 0.0 - 0.1 x10E3/uL  T4, free   Collection Time: 02/26/24  9:20 AM  Result Value Ref Range   Free T4 1.28 0.82 - 1.77 ng/dL  TSH   Collection Time: 02/26/24  9:20 AM  Result Value Ref Range   TSH 2.090 0.450 - 4.500 uIU/mL  .  Assessment & Plan:   Assessment & Plan Mixed hyperlipidemia Cholesterol levels improved with current treatment. Statins not tolerated due to muscle pain. - Continue current lipid-lowering medications. Orders:   POCT Lipid Panel   Comprehensive metabolic panel with GFR   CBC with Differential/Platelet  Essential hypertension Blood pressure elevated at 200/100 mmHg, likely due to non-adherence. Current medications: metoprolol  XL 25 mg daily, losartan  100 mg daily. - Recheck blood pressure after medication. - Scheduled follow-up for blood pressure re-evaluation. - Consider adjusting medications if blood pressure remains high.    Hypothyroidism due to acquired atrophy of thyroid Managed with levothyroxine  150 mcg daily. - Continue levothyroxine  150 mcg daily. - Ordered thyroid function tests. Orders:   T4, free   TSH  SVT (supraventricular tachycardia)     Impaired fasting glucose A1c increased to 5.9%, indicating prediabetes. Positive results from lifestyle modifications. - Continue lifestyle modifications. Orders:   POCT glycosylated hemoglobin (Hb A1C)  Carpal tunnel syndrome of right wrist Carpal tunnel syndrome and trigger finger, right hand Tingling and numbness persist despite previous steroid injection. Wrist braces not consistently used. - Encouraged use of wrist braces, especially at night. - Scheduled steroid injection for carpal tunnel syndrome.    Attention deficit hyperactivity disorder (ADHD), predominantly inattentive type Strattera  not well-tolerated due to side effects. Discussed alternative medication Qelbree. - Consider trial  of Qelbree if holding strattera  results in resolution of gi side effects.     Seasonal allergic rhinitis due to pollen Stuffy nose due to allergies, managed with Zyrtec. - Continue Zyrtec.    Encounter for immunization  Orders:   Flu vaccine, recombinant, trivalent, inj  Encounter for immunization  Orders:   Pneumococcal conjugate vaccine 20-valent   Body mass index is 38.29 kg/m.   No orders of the defined types were placed in this encounter.   Orders Placed This Encounter  Procedures   Flu vaccine, recombinant, trivalent, inj   Pneumococcal conjugate vaccine 20-valent   Comprehensive metabolic panel with GFR   CBC with Differential/Platelet   T4, free   TSH   POCT Lipid Panel   POCT glycosylated hemoglobin (Hb A1C)     I,Marla I Leal-Borjas,acting as a scribe for Abigail Free, MD.,have documented all relevant documentation on the behalf of Abigail Free, MD,as directed by  Abigail Free, MD while in the presence of Abigail Free, MD.   Follow-up: Return in about 1 day (around 02/27/2024) for BP CHECK.  An After Visit Summary was printed and given to the patient.  I attest that I have reviewed this visit and agree with the plan scribed by my staff.   Abigail Free, MD [facility] [phone]

## 2024-02-26 NOTE — Assessment & Plan Note (Addendum)
 Blood pressure elevated at 200/100 mmHg, likely due to non-adherence. Current medications: metoprolol  XL 25 mg daily, losartan  100 mg daily. - Recheck blood pressure after medication. - Scheduled follow-up for blood pressure re-evaluation. - Consider adjusting medications if blood pressure remains high.

## 2024-02-26 NOTE — Assessment & Plan Note (Addendum)
  Orders:   T4, free   TSH

## 2024-02-27 ENCOUNTER — Ambulatory Visit: Payer: Self-pay | Admitting: Family Medicine

## 2024-02-27 ENCOUNTER — Ambulatory Visit

## 2024-02-27 VITALS — BP 164/72

## 2024-02-27 DIAGNOSIS — I10 Essential (primary) hypertension: Secondary | ICD-10-CM

## 2024-02-27 LAB — CBC WITH DIFFERENTIAL/PLATELET
Basophils Absolute: 0.1 x10E3/uL (ref 0.0–0.2)
Basos: 1 %
EOS (ABSOLUTE): 0.3 x10E3/uL (ref 0.0–0.4)
Eos: 4 %
Hematocrit: 43.4 % (ref 34.0–46.6)
Hemoglobin: 14.3 g/dL (ref 11.1–15.9)
Immature Grans (Abs): 0 x10E3/uL (ref 0.0–0.1)
Immature Granulocytes: 0 %
Lymphocytes Absolute: 2.5 x10E3/uL (ref 0.7–3.1)
Lymphs: 33 %
MCH: 29.3 pg (ref 26.6–33.0)
MCHC: 32.9 g/dL (ref 31.5–35.7)
MCV: 89 fL (ref 79–97)
Monocytes Absolute: 0.5 x10E3/uL (ref 0.1–0.9)
Monocytes: 7 %
Neutrophils Absolute: 4 x10E3/uL (ref 1.4–7.0)
Neutrophils: 55 %
Platelets: 331 x10E3/uL (ref 150–450)
RBC: 4.88 x10E6/uL (ref 3.77–5.28)
RDW: 13 % (ref 11.7–15.4)
WBC: 7.4 x10E3/uL (ref 3.4–10.8)

## 2024-02-27 LAB — COMPREHENSIVE METABOLIC PANEL WITH GFR
ALT: 43 IU/L — ABNORMAL HIGH (ref 0–32)
AST: 43 IU/L — ABNORMAL HIGH (ref 0–40)
Albumin: 4.5 g/dL (ref 3.9–4.9)
Alkaline Phosphatase: 56 IU/L (ref 49–135)
BUN/Creatinine Ratio: 19 (ref 12–28)
BUN: 15 mg/dL (ref 8–27)
Bilirubin Total: 0.4 mg/dL (ref 0.0–1.2)
CO2: 20 mmol/L (ref 20–29)
Calcium: 9.6 mg/dL (ref 8.7–10.3)
Chloride: 105 mmol/L (ref 96–106)
Creatinine, Ser: 0.79 mg/dL (ref 0.57–1.00)
Globulin, Total: 2.5 g/dL (ref 1.5–4.5)
Glucose: 109 mg/dL — ABNORMAL HIGH (ref 70–99)
Potassium: 4.5 mmol/L (ref 3.5–5.2)
Sodium: 143 mmol/L (ref 134–144)
Total Protein: 7 g/dL (ref 6.0–8.5)
eGFR: 85 mL/min/1.73 (ref >59)

## 2024-02-27 LAB — TSH: TSH: 2.09 u[IU]/mL (ref 0.450–4.500)

## 2024-02-27 LAB — T4, FREE: Free T4: 1.28 ng/dL (ref 0.82–1.77)

## 2024-02-27 MED ORDER — METOPROLOL SUCCINATE ER 50 MG PO TB24: 50.0000 mg | ORAL_TABLET | Freq: Every day | ORAL | 3 refills | Status: AC

## 2024-02-27 NOTE — Progress Notes (Signed)
 Patient is in office today for a nurse visit for Blood Pressure Check. Patient blood pressure was 164/72, Patient No chest pain, No shortness of breath, No dyspnea on exertion, No orthopnea, No paroxysmal nocturnal dyspnea, No edema, No palpitations, No syncope.  BP medications are Metoprolol  25 mg, Losartan  100 mg,  Five minutes later BP was 162/80.  Per Dr Sherre - order Metoprolol  50 mg, continue current Losartan  100 mg, Make nurse appointment for two weeks for BP check. Also do a BP log at home and bring to next visit.

## 2024-02-29 DIAGNOSIS — G5601 Carpal tunnel syndrome, right upper limb: Secondary | ICD-10-CM | POA: Insufficient documentation

## 2024-02-29 DIAGNOSIS — J301 Allergic rhinitis due to pollen: Secondary | ICD-10-CM | POA: Insufficient documentation

## 2024-02-29 NOTE — Assessment & Plan Note (Signed)
 Stuffy nose due to allergies, managed with Zyrtec. - Continue Zyrtec.

## 2024-02-29 NOTE — Assessment & Plan Note (Signed)
 Carpal tunnel syndrome and trigger finger, right hand Tingling and numbness persist despite previous steroid injection. Wrist braces not consistently used. - Encouraged use of wrist braces, especially at night. - Scheduled steroid injection for carpal tunnel syndrome.

## 2024-02-29 NOTE — Assessment & Plan Note (Addendum)
 Strattera  not well-tolerated due to side effects. Discussed alternative medication Qelbree. - Consider trial of Qelbree if holding strattera  results in resolution of gi side effects.

## 2024-03-05 ENCOUNTER — Ambulatory Visit: Admitting: Family Medicine

## 2024-03-05 VITALS — BP 136/80 | HR 67 | Temp 98.0°F | Ht 61.5 in | Wt 205.0 lb

## 2024-03-05 DIAGNOSIS — I10 Essential (primary) hypertension: Secondary | ICD-10-CM | POA: Diagnosis not present

## 2024-03-05 DIAGNOSIS — E559 Vitamin D deficiency, unspecified: Secondary | ICD-10-CM

## 2024-03-05 NOTE — Assessment & Plan Note (Addendum)
 Well controlled.  No changes to medicines. Continue toprol  xl 25 mg daily, Losartan  100 mg daily. Continue to work on eating a healthy diet and exercise.

## 2024-03-05 NOTE — Assessment & Plan Note (Addendum)
 Chronic carpal tunnel syndrome with numbness and discomfort. Previous wrist braces provided inconsistent relief. Discussed injection risks including infection, bleeding, and potential symptom worsening. Orders:   Joint Injection/Arthrocentesis

## 2024-03-05 NOTE — Progress Notes (Unsigned)
 Subjective:  Patient ID: Ashley Colon, female    DOB: 1960/08/06  Age: 63 y.o. MRN: [id number]  Chief Complaint  Patient presents with   Medical Management of Chronic Issues    HPI: Discussed the use of AI scribe software for clinical note transcription with the patient, who gave verbal consent to proceed.  History of Present Illness Ashley Colon is a 63 year old female who presents with numbness and discomfort in her arm. Right CTS here for injection.  Upper extremity paresthesia and discomfort - Numbness and discomfort localized to the arm - Describes discomfort as 'all through here' - Uses braces at night for symptom relief, but often removes them in her sleep  Associated symptoms - No breathing problems - No chest pain - No headaches - No blurry vision       02/26/2024    8:40 AM 10/23/2023    8:47 AM 07/18/2023    8:15 AM 05/17/2023    2:51 PM 04/17/2023   10:30 AM  Depression screen PHQ 2/9  Decreased Interest 1 0 0 0 0  Down, Depressed, Hopeless 1 0 0 0 0  PHQ - 2 Score 2 0 0 0 0  Altered sleeping 0 0     Tired, decreased energy 2 1     Change in appetite 0 0     Feeling bad or failure about yourself  0 0     Trouble concentrating 0 0     Moving slowly or fidgety/restless 0 0     Suicidal thoughts 0 0     PHQ-9 Score 4 1      Difficult doing work/chores Somewhat difficult Somewhat difficult        Data saved with a previous flowsheet row definition        02/26/2024    8:40 AM  Fall Risk   Falls in the past year? 0  Number falls in past yr: 0  Injury with Fall? 0  Risk for fall due to : No Fall Risks  Follow up Falls evaluation completed    Patient Care Team: Sherre Clapper, MD as PCP - General (Family Medicine)   Review of Systems  Current Outpatient Medications on File Prior to Visit  Medication Sig Dispense Refill   aspirin EC 81 MG tablet Take 81 mg by mouth daily. Swallow whole.     atomoxetine  (STRATTERA ) 80 MG capsule Take 1 capsule (80  mg total) by mouth daily. 90 capsule 1   ezetimibe  (ZETIA ) 10 MG tablet Take 1 tablet by mouth once daily 90 tablet 0   fenofibrate  160 MG tablet Take 1 tablet by mouth once daily 90 tablet 0   icosapent  Ethyl (VASCEPA ) 1 g capsule Take 1 capsule by mouth twice daily 180 capsule 0   levothyroxine  (SYNTHROID ) 150 MCG tablet TAKE 1 TABLET BY MOUTH ONCE DAILY BEFORE BREAKFAST 90 tablet 1   losartan  (COZAAR ) 100 MG tablet Take 1 tablet by mouth once daily 90 tablet 1   metoprolol  succinate (TOPROL -XL) 50 MG 24 hr tablet Take 1 tablet (50 mg total) by mouth daily. Take with or immediately following a meal. 90 tablet 3   Multiple Vitamin (MULTIVITAMIN) tablet Take 1 tablet by mouth 2 (two) times daily.     oxybutynin  (DITROPAN  XL) 15 MG 24 hr tablet Take 1 tablet by mouth once daily 90 tablet 0   Vitamin D , Ergocalciferol , (DRISDOL ) 1.25 MG (50000 UNIT) CAPS capsule Take 1 capsule by mouth once a week 12 capsule  3   No current facility-administered medications on file prior to visit.   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   Hemiparesis of right dominant side as late effect of cerebral infarction (HCC) 04/29/2023   Other iron deficiency anemias 04/28/2019   Overactive bladder 04/28/2019   Past Surgical History:  Procedure Laterality Date   CESAREAN SECTION  1999   CHOLECYSTECTOMY  1990   GASTRIC BYPASS  2008    Family History  Problem Relation Age of Onset   Stroke Mother    Pulmonary embolism Mother    Arthritis/Rheumatoid Mother    Coronary artery disease Father    Diverticulitis Father    Arthritis Father    Breast cancer Maternal Aunt    Lung cancer Maternal Grandfather    Stroke Maternal Grandmother    Social History   Socioeconomic History   Marital status: Married    Spouse name: Not on file   Number of children: 1   Years of education: Not on file    Highest education level: Not on file  Occupational History   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[employer]  Tobacco Use   Smoking status: Never   Smokeless tobacco: Never  Vaping Use   Vaping status: Never Used  Substance and Sexual Activity   Alcohol use: Yes    Comment: rare   Drug use: Never   Sexual activity: Not Currently  Other Topics Concern   Not on file  Social History Narrative   Not on file   Social Drivers of Health   Tobacco Use: Low Risk (02/26/2024)   Patient History    Smoking Tobacco Use: Never    Smokeless Tobacco Use: Never    Passive Exposure: Not on file  Financial Resource Strain: Low Risk (01/17/2023)   Overall Financial Resource Strain (CARDIA)    Difficulty of Paying Living Expenses: Not very hard  Food Insecurity: No Food Insecurity (07/18/2023)   Hunger Vital Sign    Worried About Running Out of Food in the Last Year: Never true    Ran Out of Food in the Last Year: Never true  Transportation Needs: No Transportation Needs (07/18/2023)   PRAPARE - Administrator, Civil Service (Medical): No    Lack of Transportation (Non-Medical): No  Physical Activity: Sufficiently Active (01/17/2023)   Exercise Vital Sign    Days of Exercise per Week: 5 days    Minutes of Exercise per Session: 40 min  Stress: No Stress Concern Present (01/17/2023)   [employer] of Occupational Health - Occupational Stress Questionnaire    Feeling of Stress : Only a little  Social Connections: Moderately Isolated (01/17/2023)   Social Connection and Isolation Panel    Frequency of Communication with Friends and Family: More than three times a week    Frequency of Social Gatherings with Friends and Family: More than three times a week    Attends Religious Services: Never    Database Administrator or Organizations: No    Attends Banker Meetings: Never    Marital Status: Married  Depression (PHQ2-9): Low Risk (02/26/2024)   Depression (PHQ2-9)    PHQ-2 Score: 4   Alcohol Screen: Low Risk (01/17/2023)   Alcohol Screen    Last Alcohol Screening Score (AUDIT): 1  Housing: Low Risk (07/18/2023)   Housing Stability Vital Sign    Unable to Pay for Housing in the Last Year:  No    Number of Times Moved in the Last Year: 0    Homeless in the Last Year: No  Utilities: Not At Risk (07/18/2023)   AHC Utilities    Threatened with loss of utilities: No  Health Literacy: Adequate Health Literacy (01/17/2023)   B1300 Health Literacy    Frequency of need for help with medical instructions: Never    Objective:  BP 136/80   Pulse 67   Temp 98 F (36.7 C)   Ht 5' 1.5 (1.562 m)   Wt 205 lb (93 kg)   SpO2 98%   BMI 38.11 kg/m      03/05/2024    1:34 PM 02/27/2024    1:31 PM 02/26/2024    8:59 AM  BP/Weight  Systolic BP 136 164 164  Diastolic BP 80 72 88  Wt. (Lbs) 205    BMI 38.11 kg/m2      Physical Exam Vitals reviewed.  Constitutional:      Appearance: Normal appearance. She is normal weight.  Cardiovascular:     Rate and Rhythm: Normal rate and regular rhythm.     Heart sounds: Normal heart sounds.  Pulmonary:     Effort: Pulmonary effort is normal. No respiratory distress.     Breath sounds: Normal breath sounds.  Musculoskeletal:     Comments: Tinels and phalens positive on right.   Neurological:     Mental Status: She is alert and oriented to person, place, and time.  Psychiatric:        Mood and Affect: Mood normal.        Behavior: Behavior normal.     {Perform Simple Foot Exam  Perform Detailed exam:1} {Insert foot Exam (Optional):30965}    Ulnar approach under palmaris longus Lab Results  Component Value Date   WBC 7.4 02/26/2024   HGB 14.3 02/26/2024   HCT 43.4 02/26/2024   PLT 331 02/26/2024   GLUCOSE 109 (H) 02/26/2024   CHOL 146 10/23/2023   TRIG 203 (H) 10/23/2023   HDL 32 (L) 10/23/2023   LDLCALC 80 10/23/2023   ALT 43 (H) 02/26/2024   AST 43 (H) 02/26/2024   NA 143 02/26/2024   K 4.5 02/26/2024   CL  105 02/26/2024   CREATININE 0.79 02/26/2024   BUN 15 02/26/2024   CO2 20 02/26/2024   TSH 2.090 02/26/2024   HGBA1C 5.9 02/26/2024    Results for orders placed or performed in visit on 02/26/24  POCT Lipid Panel   Collection Time: 02/26/24  8:44 AM  Result Value Ref Range   TC 134    HDL 35    TRG 108    LDL 77    Non-HDL 99    TC/HDL     LDL/HDL Ratio 2.2   POCT glycosylated hemoglobin (Hb A1C)   Collection Time: 02/26/24  8:44 AM  Result Value Ref Range   Hemoglobin A1C     HbA1c POC (<> result, manual entry) 5.9 4.0 - 5.6 %   HbA1c, POC (prediabetic range)     HbA1c, POC (controlled diabetic range)    Comprehensive metabolic panel with GFR   Collection Time: 02/26/24  9:20 AM  Result Value Ref Range   Glucose 109 (H) 70 - 99 mg/dL   BUN 15 8 - 27 mg/dL   Creatinine, Ser 9.20 0.57 - 1.00 mg/dL   eGFR 85 >40 fO/fpw/8.26   BUN/Creatinine Ratio 19 12 - 28   Sodium 143 134 - 144 mmol/L   Potassium  4.5 3.5 - 5.2 mmol/L   Chloride 105 96 - 106 mmol/L   CO2 20 20 - 29 mmol/L   Calcium  9.6 8.7 - 10.3 mg/dL   Total Protein 7.0 6.0 - 8.5 g/dL   Albumin 4.5 3.9 - 4.9 g/dL   Globulin, Total 2.5 1.5 - 4.5 g/dL   Bilirubin Total 0.4 0.0 - 1.2 mg/dL   Alkaline Phosphatase 56 49 - 135 IU/L   AST 43 (H) 0 - 40 IU/L   ALT 43 (H) 0 - 32 IU/L  CBC with Differential/Platelet   Collection Time: 02/26/24  9:20 AM  Result Value Ref Range   WBC 7.4 3.4 - 10.8 x10E3/uL   RBC 4.88 3.77 - 5.28 x10E6/uL   Hemoglobin 14.3 11.1 - 15.9 g/dL   Hematocrit 56.5 65.9 - 46.6 %   MCV 89 79 - 97 fL   MCH 29.3 26.6 - 33.0 pg   MCHC 32.9 31.5 - 35.7 g/dL   RDW 86.9 88.2 - 84.5 %   Platelets 331 150 - 450 x10E3/uL   Neutrophils 55 Not Estab. %   Lymphs 33 Not Estab. %   Monocytes 7 Not Estab. %   Eos 4 Not Estab. %   Basos 1 Not Estab. %   Neutrophils Absolute 4.0 1.4 - 7.0 x10E3/uL   Lymphocytes Absolute 2.5 0.7 - 3.1 x10E3/uL   Monocytes Absolute 0.5 0.1 - 0.9 x10E3/uL   EOS (ABSOLUTE)  0.3 0.0 - 0.4 x10E3/uL   Basophils Absolute 0.1 0.0 - 0.2 x10E3/uL   Immature Granulocytes 0 Not Estab. %   Immature Grans (Abs) 0.0 0.0 - 0.1 x10E3/uL  T4, free   Collection Time: 02/26/24  9:20 AM  Result Value Ref Range   Free T4 1.28 0.82 - 1.77 ng/dL  TSH   Collection Time: 02/26/24  9:20 AM  Result Value Ref Range   TSH 2.090 0.450 - 4.500 uIU/mL  .  Assessment & Plan:   Assessment & Plan Essential hypertension Well controlled.  No changes to medicines. Continue toprol  xl 25 mg daily, Losartan  100 mg daily. Continue to work on eating a healthy diet and exercise.      Carpal tunnel syndrome of right wrist Chronic carpal tunnel syndrome with numbness and discomfort. Previous wrist braces provided inconsistent relief. Discussed injection risks including infection, bleeding, and potential symptom worsening.    Vitamin D  deficiency Managed with weekly vitamin D  supplementation.      Body mass index is 38.11 kg/m.   No orders of the defined types were placed in this encounter.   No orders of the defined types were placed in this encounter.    I,Marla I Leal-Borjas,acting as a scribe for Abigail Free, MD.,have documented all relevant documentation on the behalf of Abigail Free, MD,as directed by  Abigail Free, MD while in the presence of Abigail Free, MD.    Follow-up: No follow-ups on file.  An After Visit Summary was printed and given to the patient.  Abigail Free, MD [facility] [phone]

## 2024-03-08 NOTE — Assessment & Plan Note (Addendum)
 Managed with weekly vitamin D  supplementation.

## 2024-03-09 ENCOUNTER — Encounter: Payer: Self-pay | Admitting: Family Medicine

## 2024-03-09 DIAGNOSIS — G5601 Carpal tunnel syndrome, right upper limb: Secondary | ICD-10-CM | POA: Diagnosis not present

## 2024-03-09 MED ORDER — VITAMIN D (ERGOCALCIFEROL) 1.25 MG (50000 UNIT) PO CAPS: 50000.0000 [IU] | ORAL_CAPSULE | ORAL | 3 refills | Status: AC

## 2024-03-12 ENCOUNTER — Ambulatory Visit

## 2024-03-18 ENCOUNTER — Other Ambulatory Visit: Payer: Self-pay | Admitting: Family Medicine

## 2024-03-18 DIAGNOSIS — I10 Essential (primary) hypertension: Secondary | ICD-10-CM

## 2024-04-15 ENCOUNTER — Other Ambulatory Visit: Payer: Self-pay | Admitting: Family Medicine

## 2024-04-15 DIAGNOSIS — E782 Mixed hyperlipidemia: Secondary | ICD-10-CM

## 2024-04-26 ENCOUNTER — Other Ambulatory Visit: Payer: Self-pay | Admitting: Family Medicine

## 2024-04-27 ENCOUNTER — Other Ambulatory Visit: Payer: Self-pay | Admitting: Family Medicine

## 2024-04-27 DIAGNOSIS — F900 Attention-deficit hyperactivity disorder, predominantly inattentive type: Secondary | ICD-10-CM

## 2024-06-03 ENCOUNTER — Ambulatory Visit: Admitting: Family Medicine
